# Patient Record
Sex: Female | Born: 1970 | Race: White | Hispanic: No | Marital: Married | Smoking: Never smoker
Health system: Southern US, Community
[De-identification: ages and names within clinical notes are randomized; demographics above are authoritative.]

## PROBLEM LIST (undated history)

## (undated) DIAGNOSIS — L709 Acne, unspecified: Secondary | ICD-10-CM

## (undated) HISTORY — DX: Acne, unspecified: L70.9

## (undated) HISTORY — PX: TONSILLECTOMY: SUR1361

---

## 2001-11-05 HISTORY — PX: NASAL SINUS SURGERY: SHX719

## 2008-10-18 ENCOUNTER — Ambulatory Visit: Payer: Self-pay | Admitting: Family Medicine

## 2010-01-17 ENCOUNTER — Ambulatory Visit: Payer: Self-pay | Admitting: Unknown Physician Specialty

## 2011-01-30 ENCOUNTER — Ambulatory Visit: Payer: Self-pay | Admitting: Family Medicine

## 2012-02-14 ENCOUNTER — Ambulatory Visit: Payer: Self-pay | Admitting: Family Medicine

## 2013-02-16 ENCOUNTER — Ambulatory Visit: Payer: Self-pay | Admitting: Orthopedic Surgery

## 2014-02-18 ENCOUNTER — Ambulatory Visit: Payer: Self-pay | Admitting: Family Medicine

## 2015-02-22 ENCOUNTER — Ambulatory Visit: Admit: 2015-02-22 | Disposition: A | Discharge status: Home / Self Care | Payer: Self-pay | Admitting: Family Medicine

## 2015-02-22 ENCOUNTER — Encounter: Payer: Self-pay | Admitting: Family Medicine

## 2015-12-26 DIAGNOSIS — D239 Other benign neoplasm of skin, unspecified: Secondary | ICD-10-CM

## 2015-12-26 HISTORY — DX: Other benign neoplasm of skin, unspecified: D23.9

## 2017-01-16 ENCOUNTER — Ambulatory Visit (INDEPENDENT_AMBULATORY_CARE_PROVIDER_SITE_OTHER): Payer: BLUE CROSS/BLUE SHIELD | Admitting: Certified Nurse Midwife

## 2017-01-16 ENCOUNTER — Encounter: Payer: Self-pay | Admitting: Certified Nurse Midwife

## 2017-01-16 VITALS — BP 122/82 | HR 77 | Ht 62.0 in | Wt 174.0 lb

## 2017-01-16 DIAGNOSIS — Z01411 Encounter for gynecological examination (general) (routine) with abnormal findings: Secondary | ICD-10-CM | POA: Diagnosis not present

## 2017-01-16 DIAGNOSIS — Z124 Encounter for screening for malignant neoplasm of cervix: Secondary | ICD-10-CM

## 2017-01-16 DIAGNOSIS — N841 Polyp of cervix uteri: Secondary | ICD-10-CM

## 2017-01-16 DIAGNOSIS — T8332XD Displacement of intrauterine contraceptive device, subsequent encounter: Secondary | ICD-10-CM

## 2017-01-16 NOTE — Progress Notes (Signed)
Gynecology Annual Exam  PCP: No PCP Per Patient  Chief Complaint:  Chief Complaint  Patient presents with  . Gynecologic Exam    History of Present Illness: Patient is a 46 y.o. G3P2002 presents for annual exam. The patient was seen in January 2018 for lost IUD strings. An ultrasound at that time showed the IUD was in the endometrium. An endocervical polyp was also noted and plan was to remove this at her annual exam.  LMP: Patient's last menstrual period was 01/04/2017 (exact date). Menses are irregular on the Mirena IUD. Her LMP was the first menses since her January 17 menses. Her LMP was light and lasted 3-4 days with mild cramping. The previous menses was heavy with a lot of cramping. She has had random spotting.  Her last annual was 11/16/2015. Her health has not changed significantly since then. She sees a dermatologist regularly for treatment of acne. Last Pap was 11/16/2015 and was NIL. She has no history of abnormal Pap smears. Her last mammogram was 03/06/2016 and was negative. Her next mammogram is scheduled at [facility] on 03/11/2017. She does self breast exams occasionally. There is no family history of breast or ovarian cancer. She does not drink or use tobacco products. She has started back exercising and is taking a boot camp class. She has lost 3# since January. Her current BMI is 31.83. She does get adequate calcium in her diet and her supplement. She had cholesterol screen in 2017 and it was normal.    Review of Systems: Review of Systems  Constitutional: Negative for chills, fever and weight loss.  HENT: Negative for congestion, sinus pain and sore throat.   Eyes: Negative for blurred vision and pain.  Respiratory: Negative for hemoptysis, shortness of breath and wheezing.   Cardiovascular: Negative for chest pain, palpitations and leg swelling.  Gastrointestinal: Negative for abdominal pain, blood in stool, diarrhea, heartburn, nausea and vomiting.  Genitourinary:  Negative for dysuria, frequency, hematuria and urgency.  Musculoskeletal: Negative for back pain, joint pain and myalgias.  Skin: Negative for itching and rash.  Neurological: Negative for dizziness, tingling and headaches.  Endo/Heme/Allergies: Negative for environmental allergies and polydipsia. Does not bruise/bleed easily.       Negative for hirsutism   Psychiatric/Behavioral: Negative for depression. The patient is not nervous/anxious and does not have insomnia.     Past Medical History:  Past Medical History:  Diagnosis Date  . Acne     Past Surgical History:  Past Surgical History:  Procedure Laterality Date  . NASAL SINUS SURGERY  2003  . TONSILLECTOMY      Gynecologic History: No history of abnormal Pap smears.   Obstetric History: [id number]  Family History:  Family History  Problem Relation Age of Onset  . Hypercholesterolemia Mother   . Hypertension Mother   . Asthma Father   . Hypertension Father     Social History:  Social History   Social History  . Marital status: Married    Spouse name: N/A  . Number of children: 2  . Years of education: 16   Occupational History  . Finance administration    Social History Main Topics  . Smoking status: Never Smoker  . Smokeless tobacco: Never Used  . Alcohol use No  . Drug use: No  . Sexual activity: Yes    Birth control/ protection: IUD   Other Topics Concern  . Not on file   Social History Narrative  . No narrative on file  Allergies:  Allergies  Allergen Reactions  . Sulfa Antibiotics Swelling    Medications: Prior to Admission medications   Medication Sig Start Date End Date Taking? Authorizing Provider  Biotin 1 MG CAPS Take by mouth.   Yes Historical Provider, MD  Cholecalciferol (VITAMIN D-1000 MAX ST) 1000 units tablet Take by mouth.   Yes Historical Provider, MD  Dapsone (ACZONE) 7.5 % GEL APPLY ON THE SKIN DAILY 10/27/15  Yes Historical Provider, MD  doxycycline (ADOXA) 50 MG tablet  Take 50 mg by mouth daily.   Yes Historical Provider, MD  levonorgestrel (MIRENA) 20 MCG/24HR IUD 1 each by Intrauterine route once.   Yes Historical Provider, MD  Omega-3 Fatty Acids (FISH OIL PO) Take by mouth.   Yes Historical Provider, MD  tretinoin (RETIN-A) 1.610 % cream APPLICATION APPLY ON THE SKIN AT BEDTIME 11/08/15  Yes Historical Provider, MD  vitamin B-12 (CYANOCOBALAMIN) 1000 MCG tablet Take by mouth.   Yes Historical Provider, MD  Viactiv-one chew daily.  Physical Exam Vitals: Blood pressure 122/82, pulse 77, height 5\' 2"  (1.575 m), weight 78.9 kg (174 lb), last menstrual period 01/04/2017.  General: NAD HEENT: normocephalic, anicteric Thyroid: no enlargement, no palpable nodules Pulmonary: No increased work of breathing, CTAB Cardiovascular: RRR without murmur Breast: Breast symmetrical, no tenderness, no palpable nodules or masses, no skin or nipple retraction present, no nipple discharge.  No axillary or supraclavicular lymphadenopathy. Abdomen: soft, non-tender, non-distended.  Umbilicus without lesions.  No hepatomegaly. No masses palpable. No evidence of hernia  Genitourinary:  External: Normal external female genitalia.  Normal urethral meatus, normal  Bartholin's and Skene's glands.    Vagina: Normal vaginal mucosa, no evidence of prolapse.    Cervix: endocervical polyp noted, no IUD strings seen. Polyp grasped with uterine packing forceps and twisted off it's stalk. AGNO3 applied to cervical os for hemostasis.  Uterus: AV, NSSC, mobile,NT  Adnexa: ovaries non-enlarged, no adnexal masses, NT  Rectal: deferred  Lymphatic: no evidence of inguinal lymphadenopathy Extremities: no edema, erythema, or tenderness Neurologic: Grossly intact Psychiatric: mood appropriate, affect full       Assessment: 46 y.o. [id number] well woman exam Endocervical polyp IUD strings not present-recent ultrasound confirms normal placement.  Plan:  1)Screening for breast  cancer-Mammogram  - recommend yearly screening mammogram-already scheduled at work -continue SBE 2)Pap done  3) Osteoporosis prevention-taking calcium and vitamin D and has resumed exercise.  4) Routine healthcare maintenance including cholesterol, diabetes screening at work  5) Colon cancer screening-start at age 64  6). No IC x 2 days after polyp removal (may cause bleeding)  7) Follow up 1 year for routine annual   Dalia Heading, [place]

## 2017-01-17 ENCOUNTER — Encounter: Payer: Self-pay | Admitting: Certified Nurse Midwife

## 2017-01-17 DIAGNOSIS — T8332XA Displacement of intrauterine contraceptive device, initial encounter: Secondary | ICD-10-CM | POA: Insufficient documentation

## 2017-01-17 DIAGNOSIS — L709 Acne, unspecified: Secondary | ICD-10-CM | POA: Insufficient documentation

## 2017-01-19 LAB — IGP, APTIMA HPV
HPV APTIMA: NEGATIVE
PAP SMEAR COMMENT: 0

## 2019-09-25 DIAGNOSIS — M25562 Pain in left knee: Secondary | ICD-10-CM | POA: Insufficient documentation

## 2019-09-25 DIAGNOSIS — M222X9 Patellofemoral disorders, unspecified knee: Secondary | ICD-10-CM | POA: Insufficient documentation

## 2020-01-27 NOTE — Progress Notes (Signed)
Gynecology Annual Exam  PCP: Patient, No Pcp Per  Chief Complaint:  Chief Complaint  Patient presents with  . Gynecologic Exam    question about when IUD should be removed    History of Present Illness: Patient is a 49 y.o. [id number] presents for annual exam. She has not had a menses in over a year. Her menses on the Mirena IUD which was inserted 11/01/2014 were irregular prior to that. Has occasional hot flashes.   The patient was seen in January 2018 for lost IUD strings. An ultrasound at that time showed the IUD was in the endometrium  Her last annual was 03/18/2017. Her health has not changed significantly since then. She sees a dermatologist regularly for treatment of acne. Last Pap was 03/18/2017 and was NIL/negative. She has no history of abnormal Pap smears. Her last mammogram was 03/13/2018  and was negative. She gets her mammograms done by the mobile [facility] unit that comes to her work.  She does self breast exams occasionally. There is no family history of breast or ovarian cancer. She does not drink or use tobacco products. She has not been exercising very much since the pandemic closed the gyms down. Does go walking with her husband. . Her current BMI is 31.54. She does get adequate calcium in her diet and her supplement. She had cholesterol screen in 2020 at work  and it was normal.    Review of Systems: Review of Systems  Constitutional: Negative for chills, fever and weight loss.  HENT: Negative for congestion, sinus pain and sore throat.   Eyes: Negative for blurred vision and pain.  Respiratory: Negative for hemoptysis, shortness of breath and wheezing.   Cardiovascular: Negative for chest pain, palpitations and leg swelling.  Gastrointestinal: Negative for abdominal pain, blood in stool, diarrhea, heartburn, nausea and vomiting.  Genitourinary: Negative for dysuria, frequency, hematuria and urgency.  Musculoskeletal: Positive for joint pain (knees). Negative for  back pain and myalgias.  Skin: Negative for itching and rash.  Neurological: Negative for dizziness, tingling and headaches.  Endo/Heme/Allergies: Negative for environmental allergies and polydipsia. Does not bruise/bleed easily.       Negative for hirsutism   Psychiatric/Behavioral: Negative for depression. The patient is not nervous/anxious and does not have insomnia.     Past Medical History:  Past Medical History:  Diagnosis Date  . Acne     Past Surgical History:  Past Surgical History:  Procedure Laterality Date  . NASAL SINUS SURGERY  2003  . TONSILLECTOMY      Gynecologic History: No history of abnormal Pap smears.   Obstetric History: [id number]  Family History:  Family History  Problem Relation Age of Onset  . Hypercholesterolemia Mother   . Hypertension Mother   . Asthma Father   . Hypertension Father     Social History:  Social History   Socioeconomic History  . Marital status: Married    Spouse name: Not on file  . Number of children: 2  . Years of education: 16  . Highest education level: Not on file  Occupational History  . Occupation: Industrial/product designer  Tobacco Use  . Smoking status: Never Smoker  . Smokeless tobacco: Never Used  Substance and Sexual Activity  . Alcohol use: No  . Drug use: No  . Sexual activity: Yes    Birth control/protection: I.U.D.  Other Topics Concern  . Not on file  Social History Narrative  . Not on file   Social Determinants  of Health   Financial Resource Strain:   . Difficulty of Paying Living Expenses:   Food Insecurity:   . Worried About Charity fundraiser in the Last Year:   . Arboriculturist in the Last Year:   Transportation Needs:   . Film/video editor (Medical):   Marland Kitchen Lack of Transportation (Non-Medical):   Physical Activity:   . Days of Exercise per Week:   . Minutes of Exercise per Session:   Stress:   . Feeling of Stress :   Social Connections:   . Frequency of Communication with  Friends and Family:   . Frequency of Social Gatherings with Friends and Family:   . Attends Religious Services:   . Active Member of Clubs or Organizations:   . Attends Archivist Meetings:   Marland Kitchen Marital Status:   Intimate Partner Violence:   . Fear of Current or Ex-Partner:   . Emotionally Abused:   Marland Kitchen Physically Abused:   . Sexually Abused:     Allergies:  Allergies  Allergen Reactions  . Sulfa Antibiotics Swelling    Medications: Current Outpatient Medications:  .  Biotin 1 MG CAPS, Take by mouth., Disp: , Rfl:  .  Calcium-Vitamin D-Vitamin K 500-100-40 MG-UNT-MCG CHEW, Chew 1 Dose by mouth daily., Disp: , Rfl:  .  cetirizine (ZYRTEC) 10 MG tablet, Take 1 tablet by mouth daily., Disp: , Rfl:  .  Cholecalciferol (VITAMIN D-1000 MAX ST) 1000 units tablet, Take by mouth., Disp: , Rfl:  .  fluticasone (FLONASE) 50 MCG/ACT nasal spray, Place 2 sprays into both nostrils daily., Disp: , Rfl:  .  levonorgestrel (MIRENA) 20 MCG/24HR IUD, 1 each by Intrauterine route once., Disp: , Rfl:  .  Omega-3 Fatty Acids (FISH OIL PO), Take by mouth., Disp: , Rfl:  .  spironolactone (ALDACTONE) 50 MG tablet, Take by mouth., Disp: , Rfl:  .  TRI-LUMA 0.01-4-0.05 % CREA, Apply 1 application topically every other day., Disp: , Rfl:  .  vitamin B-12 (CYANOCOBALAMIN) 1000 MCG tablet, Take by mouth., Disp: , Rfl:  .  Dapsone (ACZONE) 7.5 % GEL, APPLY ON THE SKIN DAILY, Disp: , Rfl:  .  doxycycline (ADOXA) 50 MG tablet, Take 50 mg by mouth daily., Disp: , Rfl:  .  tretinoin (RETIN-A) Q000111Q % cream, APPLICATION APPLY ON THE SKIN AT BEDTIME, Disp: , Rfl:  Physical Exam Vitals: BP 110/80   Pulse 84   Ht 5\' 3"  (1.6 m)   Wt 178 lb (80.7 kg)   LMP  (LMP Unknown)   BMI 31.53 kg/m   General: WF in NAD HEENT: normocephalic, anicteric Thyroid: no enlargement, no palpable nodules Pulmonary: No increased work of breathing, CTAB Cardiovascular: RRR without murmur Breast: Breast symmetrical, no  tenderness, no palpable nodules or masses, no skin or nipple retraction present, no nipple discharge.  No axillary or supraclavicular lymphadenopathy. Abdomen: soft, non-tender, non-distended.  Umbilicus without lesions.  No hepatomegaly. No masses palpable. No evidence of hernia  Genitourinary:  External: Normal external female genitalia.  Normal urethral meatus, normal  Bartholin's and Skene's glands.    Vagina: Normal vaginal mucosa, no evidence of prolapse.    Cervix: no IUD strings seen. No lesions  Uterus: AV, NSSC, mobile,NT  Adnexa: ovaries non-enlarged, no adnexal masses, NT  Rectal: deferred  Lymphatic: no evidence of inguinal lymphadenopathy Extremities: no edema, erythema, or tenderness Neurologic: Grossly intact Psychiatric: mood appropriate, affect full       Assessment: 49 y.o. [id number] well  woman exam IUD strings not present but amenorrheic with previous ultrasound showing correct position of IUD ?menopausal  Plan:  1)Screening for breast cancer-Mammogram  - recommend yearly screening mammogram-usually does at work. To let me know if she needs an order for [place] -continue SBE 2)Pap done  3) Osteoporosis prevention-reviewed  calcium and vitamin D requirements and the role of exercise in preventing osteoporosis   4) Routine healthcare maintenance including cholesterol, diabetes screening at work  5) Colon cancer screening-explained options for colon cancer screening and she desires to do Cologuard. Order faxed.   6) Contraception: Will have IUD in place 6 years as of 11/01/2020. Will get [facility] and [facility] today to see if her IUD can come out at her next visit.   7) RTO in 1 year and prn.   Dalia Heading, CNM

## 2020-01-28 ENCOUNTER — Other Ambulatory Visit (HOSPITAL_COMMUNITY)
Admission: RE | Admit: 2020-01-28 | Discharge: 2020-01-28 | Disposition: A | Discharge status: Home / Self Care | Payer: BC Managed Care – PPO | Source: Ambulatory Visit | Attending: Certified Nurse Midwife | Admitting: Certified Nurse Midwife

## 2020-01-28 ENCOUNTER — Encounter: Payer: Self-pay | Admitting: Certified Nurse Midwife

## 2020-01-28 ENCOUNTER — Other Ambulatory Visit: Payer: Self-pay

## 2020-01-28 ENCOUNTER — Ambulatory Visit (INDEPENDENT_AMBULATORY_CARE_PROVIDER_SITE_OTHER): Payer: BC Managed Care – PPO | Admitting: Certified Nurse Midwife

## 2020-01-28 VITALS — BP 110/80 | HR 84 | Ht 63.0 in | Wt 178.0 lb

## 2020-01-28 DIAGNOSIS — Z124 Encounter for screening for malignant neoplasm of cervix: Secondary | ICD-10-CM | POA: Insufficient documentation

## 2020-01-28 DIAGNOSIS — N912 Amenorrhea, unspecified: Secondary | ICD-10-CM

## 2020-01-28 DIAGNOSIS — Z1239 Encounter for other screening for malignant neoplasm of breast: Secondary | ICD-10-CM

## 2020-01-28 DIAGNOSIS — Z30431 Encounter for routine checking of intrauterine contraceptive device: Secondary | ICD-10-CM | POA: Diagnosis not present

## 2020-01-28 DIAGNOSIS — Z1211 Encounter for screening for malignant neoplasm of colon: Secondary | ICD-10-CM

## 2020-01-28 DIAGNOSIS — Z01419 Encounter for gynecological examination (general) (routine) without abnormal findings: Secondary | ICD-10-CM

## 2020-01-29 LAB — FSH/LH
FSH: 4.5 m[IU]/mL
LH: 6.8 m[IU]/mL

## 2020-02-01 LAB — CYTOLOGY - PAP: Diagnosis: NEGATIVE

## 2020-02-11 ENCOUNTER — Other Ambulatory Visit: Payer: Self-pay

## 2020-02-11 ENCOUNTER — Encounter: Payer: Self-pay | Admitting: Dermatology

## 2020-02-11 ENCOUNTER — Ambulatory Visit (INDEPENDENT_AMBULATORY_CARE_PROVIDER_SITE_OTHER): Payer: BC Managed Care – PPO | Admitting: Dermatology

## 2020-02-11 DIAGNOSIS — L700 Acne vulgaris: Secondary | ICD-10-CM

## 2020-02-11 DIAGNOSIS — L811 Chloasma: Secondary | ICD-10-CM

## 2020-02-11 DIAGNOSIS — L710 Perioral dermatitis: Secondary | ICD-10-CM

## 2020-02-11 DIAGNOSIS — L821 Other seborrheic keratosis: Secondary | ICD-10-CM | POA: Diagnosis not present

## 2020-02-11 MED ORDER — SPIRONOLACTONE 25 MG PO TABS: ORAL_TABLET | ORAL | 5 refills | Status: DC

## 2020-02-11 NOTE — Progress Notes (Signed)
   Follow-Up Visit   Subjective  Angelica Gardner is a 49 y.o. female who presents for the following: Acne (patient is currently using Aczone and Spironolactone 25mg  3 po QD. ), melasma (patient is currently using Triluma, she hasn't used the Heliocare yet because she hasn't been out in the sun alot. ), and recheck ISK's (of the L cheek - resolved per patient.).  The following portions of the chart were reviewed this encounter and updated as appropriate:    Review of Systems: No other skin or systemic complaints.  Objective  Well appearing patient in no apparent distress; mood and affect are within normal limits.  A focused examination was performed including the face. Relevant physical exam findings are noted in the Assessment and Plan.  Objective  Face: 6 active papules of the perioral area.  Objective  Face: Modeling on the cheeks.  Objective  Head - Anterior (Face): 6 active papules of the perioral area   Assessment & Plan  Acne vulgaris Face  Continue Spironolactone 25mg  3 po QD and Aczone gel QD  spironolactone (ALDACTONE) 25 MG tablet - Face  Melasma Face  Continue Triluma QHS, recommend Heliocare. Recommend daily broad spectrum sunscreen SPF 30+ to sun-exposed areas, reapply every 2 hours as needed.   Perioral dermatitis Head - Anterior (Face)  Rosacea/mask acne - flaring Start [facility]Rx mix (ivermectin;metronidazole;finacea)QD    Seborrheic Keratoses - Stuck-on, waxy, tan-brown papules and plaques  - Discussed benign etiology and prognosis. - Observe - Call for any changes   Return in about 6 months (around 08/12/2020).   Luther Redo, CMA, am acting as scribe for Sarina Ser, MD .

## 2020-03-15 LAB — COLOGUARD

## 2020-03-23 ENCOUNTER — Encounter: Payer: Self-pay | Admitting: Certified Nurse Midwife

## 2020-08-11 ENCOUNTER — Other Ambulatory Visit: Payer: Self-pay

## 2020-08-11 ENCOUNTER — Ambulatory Visit (INDEPENDENT_AMBULATORY_CARE_PROVIDER_SITE_OTHER): Payer: BC Managed Care – PPO | Admitting: Dermatology

## 2020-08-11 DIAGNOSIS — L700 Acne vulgaris: Secondary | ICD-10-CM

## 2020-08-11 DIAGNOSIS — L811 Chloasma: Secondary | ICD-10-CM

## 2020-08-11 MED ORDER — SPIRONOLACTONE 25 MG PO TABS: ORAL_TABLET | ORAL | 5 refills | Status: DC

## 2020-08-11 NOTE — Progress Notes (Signed)
   Follow-Up Visit   Subjective  Angelica Gardner is a 49 y.o. female who presents for the following: Acne (face, 41m f/u, Spironolacton 25mg  3 po qd, aczone 7.5% gel qd, acne improved) and melasma (face, 45m f/u, Tri-Luma qhs).  The following portions of the chart were reviewed this encounter and updated as appropriate:  Tobacco  Allergies  Meds  Problems  Med Hx  Surg Hx  Fam Hx     Review of Systems:  No other skin or systemic complaints except as noted in HPI or Assessment and Plan.  Objective  Well appearing patient in no apparent distress; mood and affect are within normal limits.  A focused examination was performed including face. Relevant physical exam findings are noted in the Assessment and Plan.  Objective  face: Face clear today  Objective  cheeks: Reticulated hyperpigmented patches.    Assessment & Plan  Acne vulgaris face Exacerbated by mask Improved  Discussed Isotretinoin, pt declines  BP today 114/80  Cont Spironolactone 25mg  3 po qd Cont Aczone 7.5% gel qam  Spironolactone can cause increased urination and cause blood pressure to decrease. Please watch for signs of lightheadedness and be cautious when changing position. It can sometimes cause breast tenderness or an irregular period in premenopausal women. It can also increase potassium. The increase in potassium usually is not a concern unless you are taking other medicines that also increase potassium, so please be sure your doctor knows all of the other medications you are taking. This medication should not be taken  by pregnant women.   Reordered Medications spironolactone (ALDACTONE) 25 MG tablet  Melasma cheeks Cont Tri-Luma qhs to aa face up to 3 months   Return in about 6 months (around 02/09/2021) for Acne/Melasma.   I, Othelia Pulling, RMA, am acting as scribe for Sarina Ser, MD .  Documentation: I have reviewed the above documentation for accuracy and completeness, and I agree  with the above.  Sarina Ser, MD

## 2020-08-12 ENCOUNTER — Encounter: Payer: Self-pay | Admitting: Dermatology

## 2020-08-29 DIAGNOSIS — Z23 Encounter for immunization: Secondary | ICD-10-CM | POA: Diagnosis not present

## 2020-10-10 DIAGNOSIS — Z0189 Encounter for other specified special examinations: Secondary | ICD-10-CM | POA: Diagnosis not present

## 2020-10-19 DIAGNOSIS — Z043 Encounter for examination and observation following other accident: Secondary | ICD-10-CM | POA: Diagnosis not present

## 2020-10-19 DIAGNOSIS — E785 Hyperlipidemia, unspecified: Secondary | ICD-10-CM | POA: Diagnosis not present

## 2020-10-19 DIAGNOSIS — Z713 Dietary counseling and surveillance: Secondary | ICD-10-CM | POA: Diagnosis not present

## 2020-10-24 DIAGNOSIS — R059 Cough, unspecified: Secondary | ICD-10-CM | POA: Diagnosis not present

## 2020-11-15 ENCOUNTER — Other Ambulatory Visit: Payer: Self-pay | Admitting: Dermatology

## 2021-02-09 ENCOUNTER — Other Ambulatory Visit: Payer: Self-pay | Admitting: Dermatology

## 2021-02-09 DIAGNOSIS — L700 Acne vulgaris: Secondary | ICD-10-CM

## 2021-02-15 ENCOUNTER — Encounter: Payer: Self-pay | Admitting: Dermatology

## 2021-02-15 ENCOUNTER — Other Ambulatory Visit: Payer: Self-pay

## 2021-02-15 ENCOUNTER — Ambulatory Visit (INDEPENDENT_AMBULATORY_CARE_PROVIDER_SITE_OTHER): Payer: BC Managed Care – PPO | Admitting: Dermatology

## 2021-02-15 DIAGNOSIS — L700 Acne vulgaris: Secondary | ICD-10-CM | POA: Diagnosis not present

## 2021-02-15 DIAGNOSIS — L811 Chloasma: Secondary | ICD-10-CM | POA: Diagnosis not present

## 2021-02-15 MED ORDER — SPIRONOLACTONE 25 MG PO TABS: 3.0000 | ORAL_TABLET | Freq: Once | ORAL | 1 refills | Status: DC

## 2021-02-15 NOTE — Patient Instructions (Addendum)
If you have any questions or concerns for your doctor, please call our main line at [phone] and press option 4 to reach your doctor's medical assistant. If no one answers, please leave a voicemail as directed and we will return your call as soon as possible. Messages left after 4 pm will be answered the following business day.   You may also send us a message via MyChart. We typically respond to MyChart messages within 1-2 business days.  For prescription refills, please ask your pharmacy to contact our office. Our fax number is [phone].  If you have an urgent issue when the clinic is closed that cannot wait until the next business day, you can page your doctor at the number below.    Please note that while we do our best to be available for urgent issues outside of office hours, we are not available 24/7.   If you have an urgent issue and are unable to reach us, you may choose to seek medical care at your doctor's office, retail clinic, urgent care center, or emergency room.  If you have a medical emergency, please immediately call 911 or go to the emergency department.  Pager Numbers  - Dr. Kowalski: [phone]  - Dr. Moye: [phone]  - Dr. Stewart: [phone]  In the event of inclement weather, please call our main line at [phone] for an update on the status of any delays or closures.  Dermatology Medication Tips: Please keep the boxes that topical medications come in in order to help keep track of the instructions about where and how to use these. Pharmacies typically print the medication instructions only on the boxes and not directly on the medication tubes.   If your medication is too expensive, please contact our office at [phone] option 4 or send us a message through MyChart.   We are unable to tell what your co-pay for medications will be in advance as this is different depending on your insurance coverage. However, we may be able to find a substitute  medication at lower cost or fill out paperwork to get insurance to cover a needed medication.   If a prior authorization is required to get your medication covered by your insurance company, please allow us 1-2 business days to complete this process.  Drug prices often vary depending on where the prescription is filled and some pharmacies may offer cheaper prices.  The website www.goodrx.com contains coupons for medications through different pharmacies. The prices here do not account for what the cost may be with help from insurance (it may be cheaper with your insurance), but the website can give you the price if you did not use any insurance.  - You can print the associated coupon and take it with your prescription to the pharmacy.  - You may also stop by our office during regular business hours and pick up a GoodRx coupon card.  - If you need your prescription sent electronically to a different pharmacy, notify our office through [place] MyChart or by phone at [phone] option 4.  Instructions for [facility] Medications  One or more of your medications was sent to the [facility] mail order compounding pharmacy. You will receive an email from them and can purchase the medicine through that link. It will then be mailed to your home at the address you confirmed. If for any reason you do not receive an email from them, please check your spam folder. If you still do not find the email,   please let us know. [facility] phone number is [phone].   

## 2021-02-15 NOTE — Progress Notes (Signed)
   Follow-Up Visit   Subjective  Angelica Gardner is a 50 y.o. female who presents for the following: Acne (Face, 27m f/u, Spironolactone 25mg  3 po qd, no s/e from spironolactone, Aczone 7.5% gel qod) and Melasma (Face, last used Tri-Luma ~8 wks ago).  The following portions of the chart were reviewed this encounter and updated as appropriate:   Tobacco  Allergies  Meds  Problems  Med Hx  Surg Hx  Fam Hx     Review of Systems:  No other skin or systemic complaints except as noted in HPI or Assessment and Plan.  Objective  Well appearing patient in no apparent distress; mood and affect are within normal limits.  A focused examination was performed including face. Relevant physical exam findings are noted in the Assessment and Plan.  Objective  face: Face clear today  Objective  face: Reticulated hyperpigmented patches.    Assessment & Plan  Acne vulgaris face Chronic and persistent but improved on current treatment regimen.  BP today 117/79  Cont Spironolactone 25mg  3 po qd #270 1rf Cont Aczone 7.5% qod to qd  Discussed Isotretinoin but patient declines isotretinoin today.  Spironolactone can cause increased urination and cause blood pressure to decrease. Please watch for signs of lightheadedness and be cautious when changing position. It can sometimes cause breast tenderness or an irregular period in premenopausal women. It can also increase potassium. The increase in potassium usually is not a concern unless you are taking other medicines that also increase potassium, so please be sure your doctor knows all of the other medications you are taking. This medication should not be taken by pregnant women.  This medicine should also not be taken together with sulfa drugs like Bactrim (trimethoprim/sulfamethexazole).   Reordered Medications spironolactone (ALDACTONE) 25 MG tablet  Melasma face Melasma is a condition of persistent pigmented patches generally on the  face, worse in summer due to higher UV exposure.  Oral estrogen containing BCPs or supplements can exacerbate condition.  Recommend daily broad spectrum tinted sunscreen SPF 30+ to face, preferably with Zinc or Titanium Dioxide. Discussed Rx topical bleaching creams (i.e. hydroquinone), OTC HelioCare supplement, chemical peels (would need multiple for best result).    Will prescribe [facility] Hydroquinone 12%/kojic acid/vitamin C cream pea sized amount twice daily to the entire face for up to 3 months. This cannot be used more than 3 months due to risk of exogenous ochronosis (permanent dark spots). The patient was advised this is not covered by insurance since it is made by a compounding pharmacy. They will receive an email to check out and the medication will be mailed to their home.    Return in about 6 months (around 08/17/2021) for Acne/Melasma f/u.  I, Othelia Pulling, RMA, am acting as scribe for Sarina Ser, MD .  Documentation: I have reviewed the above documentation for accuracy and completeness, and I agree with the above.  Sarina Ser, MD

## 2021-08-23 ENCOUNTER — Other Ambulatory Visit: Payer: Self-pay

## 2021-08-23 ENCOUNTER — Ambulatory Visit (INDEPENDENT_AMBULATORY_CARE_PROVIDER_SITE_OTHER): Payer: BC Managed Care – PPO | Admitting: Dermatology

## 2021-08-23 DIAGNOSIS — L700 Acne vulgaris: Secondary | ICD-10-CM

## 2021-08-23 MED ORDER — SPIRONOLACTONE 25 MG PO TABS: 75.0000 mg | ORAL_TABLET | Freq: Once | ORAL | 1 refills | Status: DC

## 2021-08-23 MED ORDER — WINLEVI 1 % EX CREA: 1.0000 "application " | TOPICAL_CREAM | Freq: Every day | CUTANEOUS | 6 refills | Status: DC

## 2021-08-23 NOTE — Progress Notes (Signed)
   Follow-Up Visit   Subjective  Angelica Gardner is a 50 y.o. female who presents for the following: Acne (6 month follow up - Spironolactone 25 mg 3 po qd, Aczone prn flares).  The following portions of the chart were reviewed this encounter and updated as appropriate:   Tobacco  Allergies  Meds  Problems  Med Hx  Surg Hx  Fam Hx     Review of Systems:  No other skin or systemic complaints except as noted in HPI or Assessment and Plan.  Objective  Well appearing patient in no apparent distress; mood and affect are within normal limits.  A focused examination was performed including face. Relevant physical exam findings are noted in the Assessment and Plan.  Face B/P today 135/89  2 pink papules of chin.  Mild hyperpigmented spotty macules of mandible.   Assessment & Plan  Acne vulgaris Face Chronic and persistent; not to goal but improved. Pt declines Isotretinoin. Start Winlevi Continue oral Spironolactone Continue Aczone topically.  Clascoterone (WINLEVI) 1 % CREA - Face Apply 1 application topically daily. Related Medication spironolactone (ALDACTONE) 25 MG tablet Take 3 tablets (75 mg total) by mouth once for 1 dose. TAKE 3 TABLETS BY MOUTH EVERY DAY  Return in about 6 months (around 02/21/2022) for Acne.  I, Ashok Cordia, CMA, am acting as scribe for Sarina Ser, MD . Documentation: I have reviewed the above documentation for accuracy and completeness, and I agree with the above.  Sarina Ser, MD

## 2021-08-23 NOTE — Patient Instructions (Signed)

## 2021-08-24 ENCOUNTER — Encounter: Payer: Self-pay | Admitting: Dermatology

## 2021-09-07 DIAGNOSIS — Z23 Encounter for immunization: Secondary | ICD-10-CM | POA: Diagnosis not present

## 2021-10-10 DIAGNOSIS — Z0189 Encounter for other specified special examinations: Secondary | ICD-10-CM | POA: Diagnosis not present

## 2021-10-25 DIAGNOSIS — Z0189 Encounter for other specified special examinations: Secondary | ICD-10-CM | POA: Diagnosis not present

## 2021-11-07 DIAGNOSIS — Z7189 Other specified counseling: Secondary | ICD-10-CM | POA: Diagnosis not present

## 2021-11-29 ENCOUNTER — Other Ambulatory Visit: Payer: Self-pay | Admitting: Obstetrics and Gynecology

## 2021-11-29 ENCOUNTER — Ambulatory Visit
Admission: RE | Admit: 2021-11-29 | Discharge: 2021-11-29 | Disposition: A | Discharge status: Home / Self Care | Payer: BC Managed Care – PPO | Source: Ambulatory Visit | Attending: Obstetrics and Gynecology | Admitting: Obstetrics and Gynecology

## 2021-11-29 ENCOUNTER — Other Ambulatory Visit: Payer: Self-pay

## 2021-11-29 DIAGNOSIS — Z1231 Encounter for screening mammogram for malignant neoplasm of breast: Secondary | ICD-10-CM | POA: Diagnosis not present

## 2021-12-18 DIAGNOSIS — U071 COVID-19: Secondary | ICD-10-CM | POA: Diagnosis not present

## 2021-12-18 DIAGNOSIS — R059 Cough, unspecified: Secondary | ICD-10-CM | POA: Diagnosis not present

## 2022-01-15 ENCOUNTER — Telehealth: Payer: Self-pay | Admitting: Obstetrics and Gynecology

## 2022-01-15 NOTE — Telephone Encounter (Signed)
Pt is scheduled for Mirena removal and reinsertion with ABC on 4/18. ?

## 2022-01-18 NOTE — Telephone Encounter (Signed)
Patient r/s to 4/28 with ABC

## 2022-01-19 NOTE — Telephone Encounter (Signed)
Noted. Will order to arrive by apt date/time. 

## 2022-02-16 ENCOUNTER — Other Ambulatory Visit: Payer: Self-pay | Admitting: Dermatology

## 2022-02-16 DIAGNOSIS — L700 Acne vulgaris: Secondary | ICD-10-CM

## 2022-02-20 ENCOUNTER — Ambulatory Visit: Payer: Self-pay | Admitting: Obstetrics and Gynecology

## 2022-02-21 DIAGNOSIS — H1032 Unspecified acute conjunctivitis, left eye: Secondary | ICD-10-CM | POA: Diagnosis not present

## 2022-02-26 NOTE — Telephone Encounter (Signed)
Patient rescheduled to 03/26/22 with ABC

## 2022-02-27 DIAGNOSIS — J069 Acute upper respiratory infection, unspecified: Secondary | ICD-10-CM | POA: Diagnosis not present

## 2022-02-28 ENCOUNTER — Encounter: Payer: Self-pay | Admitting: Dermatology

## 2022-02-28 ENCOUNTER — Ambulatory Visit: Payer: Self-pay | Admitting: Obstetrics and Gynecology

## 2022-02-28 ENCOUNTER — Ambulatory Visit (INDEPENDENT_AMBULATORY_CARE_PROVIDER_SITE_OTHER): Payer: BC Managed Care – PPO | Admitting: Dermatology

## 2022-02-28 DIAGNOSIS — L700 Acne vulgaris: Secondary | ICD-10-CM | POA: Diagnosis not present

## 2022-02-28 DIAGNOSIS — Z79899 Other long term (current) drug therapy: Secondary | ICD-10-CM | POA: Diagnosis not present

## 2022-02-28 MED ORDER — SPIRONOLACTONE 25 MG PO TABS: 75.0000 mg | ORAL_TABLET | Freq: Every day | ORAL | 1 refills | Status: DC

## 2022-02-28 NOTE — Patient Instructions (Addendum)
Continue Winlevi once daily to face. ?Continue Dapsone once daily to face.  ?Continue Spironolactone '25mg'$  3 tablets daily.  ? ?Spironolactone can cause increased urination and cause blood pressure to decrease. Please watch for signs of lightheadedness and be cautious when changing position. It can sometimes cause breast tenderness or an irregular period in premenopausal women. It can also increase potassium. The increase in potassium usually is not a concern unless you are taking other medicines that also increase potassium, so please be sure your doctor knows all of the other medications you are taking. This medication should not be taken by pregnant women.  This medicine should also not be taken together with sulfa drugs like Bactrim (trimethoprim/sulfamethexazole).   ? ?If You Need Anything After Your Visit ? ?If you have any questions or concerns for your doctor, please call our main line at [phone] and press option 4 to reach your doctor's medical assistant. If no one answers, please leave a voicemail as directed and we will return your call as soon as possible. Messages left after 4 pm will be answered the following business day.  ? ?You may also send [place] a message via MyChart. We typically respond to MyChart messages within 1-2 business days. ? ?For prescription refills, please ask your pharmacy to contact our office. Our fax number is [phone]. ? ?If you have an urgent issue when the clinic is closed that cannot wait until the next business day, you can page your doctor at the number below.   ? ?Please note that while we do our best to be available for urgent issues outside of office hours, we are not available 24/7.  ? ?If you have an urgent issue and are unable to reach [place], you may choose to seek medical care at your doctor's office, retail clinic, urgent care center, or emergency room. ? ?If you have a medical emergency, please immediately call 911 or go to the emergency department. ? ?Pager  Numbers ? ?- Dr. Nehemiah Massed: [phone] ? ?- Dr. Laurence Ferrari: [phone] ? ?- Dr. Nicole Kindred: [phone] ? ?In the event of inclement weather, please call our main line at [phone] for an update on the status of any delays or closures. ? ?Dermatology Medication Tips: ?Please keep the boxes that topical medications come in in order to help keep track of the instructions about where and how to use these. Pharmacies typically print the medication instructions only on the boxes and not directly on the medication tubes.  ? ?If your medication is too expensive, please contact our office at [phone] option 4 or send [place] a message through [place].  ? ?We are unable to tell what your co-pay for medications will be in advance as this is different depending on your insurance coverage. However, we may be able to find a substitute medication at lower cost or fill out paperwork to get insurance to cover a needed medication.  ? ?If a prior authorization is required to get your medication covered by your insurance company, please allow [place] 1-2 business days to complete this process. ? ?Drug prices often vary depending on where the prescription is filled and some pharmacies may offer cheaper prices. ? ?The website www.goodrx.com contains coupons for medications through different pharmacies. The prices here do not account for what the cost may be with help from insurance (it may be cheaper with your insurance), but the website can give you the price if you did not use any insurance.  ?- You can print the associated coupon and  take it with your prescription to the pharmacy.  ?- You may also stop by our office during regular business hours and pick up a GoodRx coupon card.  ?- If you need your prescription sent electronically to a different pharmacy, notify our office through [facility] or by phone at [phone] option 4. ? ? ? ? ?Si Usted Necesita Algo Despu?s de Su Visita ? ?Tambi?n puede enviarnos un mensaje a trav?s de  MyChart. Por lo general respondemos a los mensajes de MyChart en el transcurso de 1 a 2 d?as h?biles. ? ?Para renovar recetas, por favor pida a su farmacia que se ponga en contacto con nuestra oficina. Nuestro n?mero de fax es el [phone]. ? ?Si tiene un asunto urgente cuando la cl?nica est? cerrada y que no puede esperar hasta el siguiente d?a h?bil, puede llamar/localizar a su doctor(a) al n?mero que aparece a continuaci?n.  ? ?Por favor, tenga en cuenta que aunque hacemos todo lo posible para estar disponibles para asuntos urgentes fuera del horario de oficina, no estamos disponibles las 24 horas del d?a, los 7 d?as de la semana.  ? ?Si tiene un problema urgente y no puede comunicarse con nosotros, puede optar por buscar atenci?n m?dica  en el consultorio de su doctor(a), en una cl?nica privada, en un centro de atenci?n urgente o en una sala de emergencias. ? ?Si tiene Engineer, maintenance (IT) m?dica, por favor llame inmediatamente al 911 o vaya a la sala de emergencias. ? ?N?meros de b?per ? ?- Dr. Nehemiah Massed: [phone] ? ?- Dra. Moye: [phone] ? ?- Dra. Nicole Kindred: [phone] ? ?En caso de inclemencias del tiempo, por favor llame a nuestra l?nea principal al [phone] para una actualizaci?n sobre el estado de cualquier retraso o cierre. ? ?Consejos para la medicaci?n en dermatolog?a: ?Por favor, guarde las cajas en las que vienen los medicamentos de uso t?pico para ayudarle a seguir las instrucciones sobre d?nde y c?mo usarlos. Las farmacias generalmente imprimen las instrucciones del medicamento s?lo en las cajas y no directamente en los tubos del [place].  ? ?Si su medicamento es muy caro, por favor, p?ngase en contacto con Zigmund Daniel llamando al [phone] y presione la opci?n 4 o env?enos un mensaje a trav?s de MyChart.  ? ?No podemos decirle cu?l ser? su copago por los medicamentos por adelantado ya que esto es diferente dependiendo de la cobertura de su seguro. Sin embargo, es posible que  podamos encontrar un medicamento sustituto a Electrical engineer un formulario para que el seguro cubra el medicamento que se considera necesario.  ? ?Si se requiere Ardelia Mems autorizaci?n previa para que su compa??a de seguros [place] su medicamento, por favor perm?tanos de 1 a 2 d?as h?biles para completar este proceso. ? ?Los precios de los medicamentos var?an con frecuencia dependiendo del Environmental consultant de d?nde se surte la receta y alguna farmacias pueden ofrecer precios m?s baratos. ? ?El sitio web www.goodrx.com tiene cupones para medicamentos de Airline pilot. Los precios aqu? no tienen en cuenta lo que podr?a costar con la ayuda del seguro (puede ser m?s barato con su seguro), pero el sitio web puede darle el precio si no utiliz? ning?n seguro.  ?- Puede imprimir el cup?n correspondiente y llevarlo con su receta a la farmacia.  ?- Tambi?n puede pasar por nuestra oficina durante el horario de atenci?n regular y recoger una tarjeta de cupones de GoodRx.  ?- Si necesita que su receta se env?e electr?nicamente a Chiropodist, informe a nuestra oficina a trav?s de Pharmacist, community  [place] o por tel?fono llamando al [phone] y presione la opci?n 4.  ?

## 2022-02-28 NOTE — Progress Notes (Signed)
? ?  Follow-Up Visit ?  ?Subjective  ?Angelica Gardner is a 51 y.o. female who presents for the following: Acne (6 month recheck. Face. Using Winlevi, Dapsone (not consistently), and taking Spironolactone '25mg'$  3 tablets daily as directed. Tolerating well, acne is stable. Having very rare break outs). ? ?The following portions of the chart were reviewed this encounter and updated as appropriate:  Tobacco  Allergies  Meds  Problems  Med Hx  Surg Hx  Fam Hx   ?  ?Review of Systems: No other skin or systemic complaints except as noted in HPI or Assessment and Plan. ? ? ?Objective  ?Well appearing patient in no apparent distress; mood and affect are within normal limits. ? ?A focused examination was performed including face, neck. Relevant physical exam findings are noted in the Assessment and Plan. ? ?Head - Anterior (Face) ?Few erythematous papules at perioral area ? ? ?Assessment & Plan  ?Acne vulgaris ?Head - Anterior (Face) ?Chronic and persistent condition with duration or expected duration over one year. Condition is symptomatic / bothersome to patient. Not to goal. ? ?Continue Winlevi once daily to face. ?Continue Dapsone once daily to face.  ?Continue Spironolactone '25mg'$  3 tablets daily.  ? ?Discussed increasing to Spironolactone 100 mg daily, patient prefers to stay at 75 mg daily.  ? ?BP: 125/80 ? ?Long term medication management.  Patient is using long term (months to years) prescription medication  to control their dermatologic condition.  These medications require periodic monitoring to evaluate for efficacy and side effects and may require periodic laboratory monitoring. ? ?Related Medications ?Clascoterone (WINLEVI) 1 % CREA ?Apply 1 application topically daily. ? ?spironolactone (ALDACTONE) 25 MG tablet ?Take 3 tablets (75 mg total) by mouth daily. ? ?Return in about 6 months (around 08/30/2022) for Acne Follow Up, TBSE. ? ?I, Emelia Salisbury, CMA, am acting as scribe for Sarina Ser,  MD. ?Documentation: I have reviewed the above documentation for accuracy and completeness, and I agree with the above. ? ?Sarina Ser, MD ? ? ?

## 2022-03-01 NOTE — Telephone Encounter (Signed)
Noted  

## 2022-03-11 ENCOUNTER — Encounter: Payer: Self-pay | Admitting: Dermatology

## 2022-03-25 NOTE — Progress Notes (Signed)
PCP: Patient, No Pcp Per (Inactive)   Chief Complaint  Patient presents with  . Gynecologic Exam    Some spotting every now and then x 6 months, no cramping noticed during spotting    HPI:      [name] is a 51 y.o. [id number] whose LMP was No LMP recorded. (Menstrual status: IUD)., presents today for her annual examination.  Her menses are {norm/abn:715}, lasting {number: 22536} days.  Dysmenorrhea {dysmen:716}. She {does:18564} have intermenstrual bleeding. She {does:18564} have vasomotor sx.   Sex activity: single partner, contraception - {contraception:315051}. Mirena placed 11/01/14; She {does:18564} have vaginal dryness.  Last Pap: 01/28/20 Results were: no abnormalities /neg HPV DNA 2018 Hx of STDs: {STD hx:14358}  Last mammogram: 11/29/21 Results were: normal--routine follow-up in 12 months There is no FH of breast cancer. There is no FH of ovarian cancer. The patient {does:18564} do self-breast exams.  Colonoscopy: NEG cologuard 5/21; repeat due after 3 years.   Tobacco use: {tob:20664} Alcohol use: {Alcohol:11675} No drug use Exercise: {exercise:31265}  She {does:18564} get adequate calcium and Vitamin D in her diet.  Labs with PCP.   Patient Active Problem List   Diagnosis Date Noted  . IUD strings lost 01/17/2017  . Acne 01/17/2017    Past Surgical History:  Procedure Laterality Date  . NASAL SINUS SURGERY  2003  . TONSILLECTOMY      Family History  Problem Relation Age of Onset  . Hypercholesterolemia Mother   . Hypertension Mother   . Asthma Father   . Hypertension Father   . Breast cancer Neg Hx     Social History   Socioeconomic History  . Marital status: Married    Spouse name: Not on file  . Number of children: 2  . Years of education: 72  . Highest education level: Not on file  Occupational History  . Occupation: Industrial/product designer  Tobacco Use  . Smoking status: Never  . Smokeless tobacco: Never  Vaping Use  .  Vaping Use: Never used  Substance and Sexual Activity  . Alcohol use: No  . Drug use: No  . Sexual activity: Yes    Birth control/protection: I.U.D.    Comment: Mirena  Other Topics Concern  . Not on file  Social History Narrative  . Not on file   Social Determinants of Health   Financial Resource Strain: Not on file  Food Insecurity: Not on file  Transportation Needs: Not on file  Physical Activity: Not on file  Stress: Not on file  Social Connections: Not on file  Intimate Partner Violence: Not on file     Current Outpatient Medications:  .  Biotin 1 MG CAPS, Take by mouth., Disp: , Rfl:  .  cetirizine (ZYRTEC) 10 MG tablet, Take 1 tablet by mouth daily., Disp: , Rfl:  .  Cholecalciferol 25 MCG (1000 UT) tablet, Take by mouth., Disp: , Rfl:  .  Clascoterone (WINLEVI) 1 % CREA, Apply 1 application topically daily., Disp: 60 g, Rfl: 6 .  Dapsone 7.5 % GEL, APPLY A SMALL AMOUNT TO AFFECTED AREA EVERY MORNING, Disp: 60 g, Rfl: 2 .  fluticasone (FLONASE) 50 MCG/ACT nasal spray, Place 2 sprays into both nostrils daily., Disp: , Rfl:  .  levonorgestrel (MIRENA) 20 MCG/24HR IUD, 1 each by Intrauterine route once., Disp: , Rfl:  .  Omega-3 Fatty Acids (FISH OIL PO), Take by mouth., Disp: , Rfl:  .  spironolactone (ALDACTONE) 25 MG tablet, Take 3 tablets (75  mg total) by mouth daily., Disp: 270 tablet, Rfl: 1 .  TRI-LUMA 0.01-4-0.05 % CREA, APPLY A SMALL AMOUNT TO AFFECTED AREA AT BEDTIME, Disp: 30 g, Rfl: 0 .  vitamin B-12 (CYANOCOBALAMIN) 1000 MCG tablet, Take by mouth., Disp: , Rfl:      ROS:  Review of Systems BREAST: No symptoms    Objective: BP 122/84   Ht '5\' 2"'$  (1.575 m)   Wt 182 lb (82.6 kg)   BMI 33.29 kg/m    OBGyn Exam  Results: No results found for this or any previous visit (from the past 24 hour(s)).  Assessment/Plan:  Encounter for annual routine gynecological examination  Cervical cancer screening  Screening for HPV (human  papillomavirus)  Encounter for removal and reinsertion of intrauterine contraceptive device (IUD)  Encounter for screening mammogram for malignant neoplasm of breast   No orders of the defined types were placed in this encounter.           GYN counsel {counseling: 16159}    F/U  No follow-ups on file.  Doyal Saric B. Silvano Garofano, PA-C 03/26/2022 1:32 PM

## 2022-03-26 ENCOUNTER — Encounter: Payer: Self-pay | Admitting: Obstetrics and Gynecology

## 2022-03-26 ENCOUNTER — Ambulatory Visit (INDEPENDENT_AMBULATORY_CARE_PROVIDER_SITE_OTHER): Payer: BC Managed Care – PPO | Admitting: Obstetrics and Gynecology

## 2022-03-26 ENCOUNTER — Other Ambulatory Visit (HOSPITAL_COMMUNITY)
Admission: RE | Admit: 2022-03-26 | Discharge: 2022-03-26 | Disposition: A | Discharge status: Home / Self Care | Payer: BC Managed Care – PPO | Source: Ambulatory Visit

## 2022-03-26 VITALS — BP 122/84 | Ht 62.0 in | Wt 182.0 lb

## 2022-03-26 DIAGNOSIS — Z30431 Encounter for routine checking of intrauterine contraceptive device: Secondary | ICD-10-CM | POA: Diagnosis not present

## 2022-03-26 DIAGNOSIS — Z01419 Encounter for gynecological examination (general) (routine) without abnormal findings: Secondary | ICD-10-CM | POA: Diagnosis not present

## 2022-03-26 DIAGNOSIS — N841 Polyp of cervix uteri: Secondary | ICD-10-CM | POA: Diagnosis not present

## 2022-03-26 DIAGNOSIS — Z1231 Encounter for screening mammogram for malignant neoplasm of breast: Secondary | ICD-10-CM

## 2022-03-26 DIAGNOSIS — Z30433 Encounter for removal and reinsertion of intrauterine contraceptive device: Secondary | ICD-10-CM

## 2022-03-26 DIAGNOSIS — Z1151 Encounter for screening for human papillomavirus (HPV): Secondary | ICD-10-CM

## 2022-03-26 DIAGNOSIS — Z124 Encounter for screening for malignant neoplasm of cervix: Secondary | ICD-10-CM | POA: Diagnosis not present

## 2022-03-26 NOTE — Patient Instructions (Signed)
I value your feedback and you entrusting [place] with your care. If you get a [facility] patient survey, I would appreciate you taking the time to let us know about your experience today. Thank you!  [place] [facility]: [place]: [facility] [phone]  Instructions after IUD insertion  Most women experience no significant problems after insertion of an IUD, however minor cramping and spotting for a few days is common. Cramps may be treated with ibuprofen '800mg'$  every 8 hours or Tylenol 650 mg every 4 hours. Contact [facility] immediately if you experience any of the following symptoms during the next week: temperature >99.6 degrees, worsening pelvic pain, abdominal pain, fainting, unusually heavy vaginal bleeding, foul vaginal discharge, or if you think you have expelled the IUD.  Nothing inserted in the vagina for 48 hours. You will be scheduled for a follow up visit in approximately four weeks.  You should check monthly to be sure you can feel the IUD strings in the upper vagina. If you are having a monthly period, try to check after each period. If you cannot feel the IUD strings,  contact [facility] immediately so we can do an exam to determine if the IUD has been expelled.   Please use backup protection until we can confirm the IUD is in place.  Call [facility] if you are exposed to or diagnosed with a sexually transmitted infection, as we will need to discuss whether it is safe for you to continue using an IUD.

## 2022-03-27 ENCOUNTER — Other Ambulatory Visit (HOSPITAL_COMMUNITY)
Admission: RE | Admit: 2022-03-27 | Discharge: 2022-03-27 | Disposition: A | Discharge status: Home / Self Care | Payer: BC Managed Care – PPO | Source: Ambulatory Visit | Attending: Obstetrics and Gynecology | Admitting: Obstetrics and Gynecology

## 2022-03-27 ENCOUNTER — Encounter: Payer: Self-pay | Admitting: Obstetrics and Gynecology

## 2022-03-27 DIAGNOSIS — N841 Polyp of cervix uteri: Secondary | ICD-10-CM | POA: Insufficient documentation

## 2022-03-27 DIAGNOSIS — Z124 Encounter for screening for malignant neoplasm of cervix: Secondary | ICD-10-CM | POA: Diagnosis not present

## 2022-03-27 DIAGNOSIS — N72 Inflammatory disease of cervix uteri: Secondary | ICD-10-CM | POA: Diagnosis not present

## 2022-03-27 DIAGNOSIS — Z1151 Encounter for screening for human papillomavirus (HPV): Secondary | ICD-10-CM | POA: Insufficient documentation

## 2022-03-28 LAB — CYTOLOGY - PAP
Comment: NEGATIVE
Diagnosis: NEGATIVE
High risk HPV: NEGATIVE

## 2022-03-29 LAB — SURGICAL PATHOLOGY

## 2022-08-30 ENCOUNTER — Ambulatory Visit (INDEPENDENT_AMBULATORY_CARE_PROVIDER_SITE_OTHER): Payer: BC Managed Care – PPO | Admitting: Dermatology

## 2022-08-30 DIAGNOSIS — L700 Acne vulgaris: Secondary | ICD-10-CM

## 2022-08-30 DIAGNOSIS — L821 Other seborrheic keratosis: Secondary | ICD-10-CM | POA: Diagnosis not present

## 2022-08-30 DIAGNOSIS — Z79899 Other long term (current) drug therapy: Secondary | ICD-10-CM | POA: Diagnosis not present

## 2022-08-30 IMAGING — MG MM DIGITAL SCREENING BILAT W/ TOMO AND CAD
8 series · 8 of 24 positions shown · non-contrast
Comparison: Previous exam(s).

CLINICAL DATA: Screening.

EXAM:
DIGITAL SCREENING BILATERAL MAMMOGRAM WITH TOMOSYNTHESIS AND CAD
TECHNIQUE: Bilateral screening digital craniocaudal and mediolateral oblique
mammograms were obtained. Bilateral screening digital breast
tomosynthesis was performed. The images were evaluated with
computer-aided detection.

[L CC synth-2D]
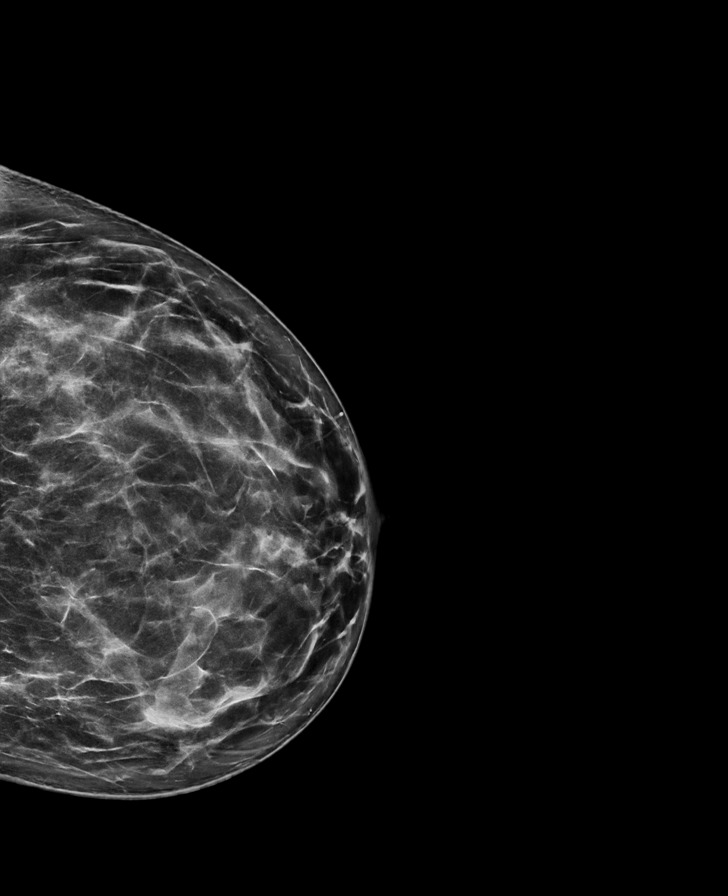

[R MLO synth-2D]
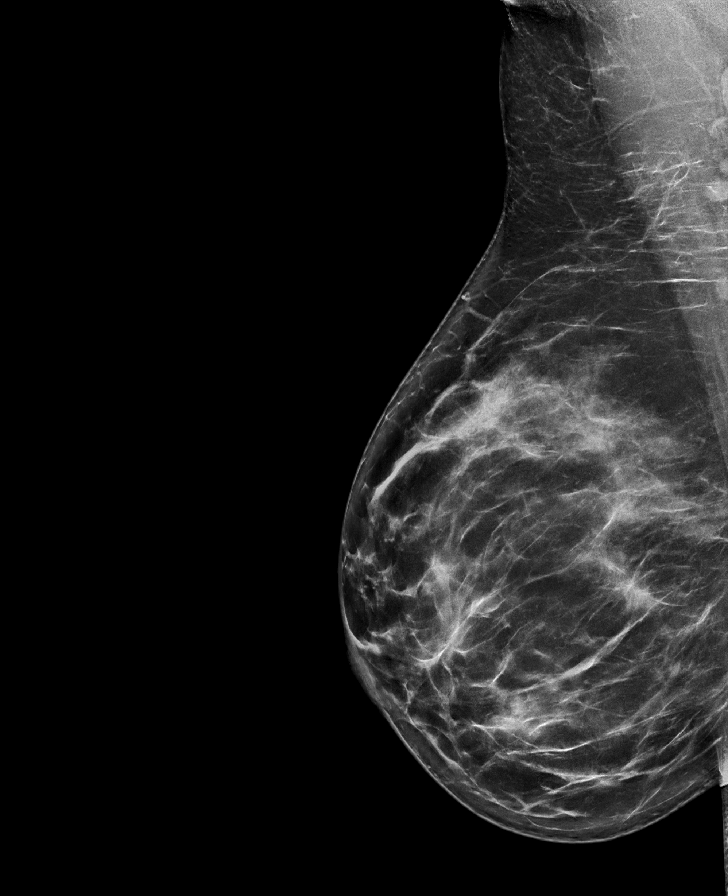

[L MLO synth-2D]
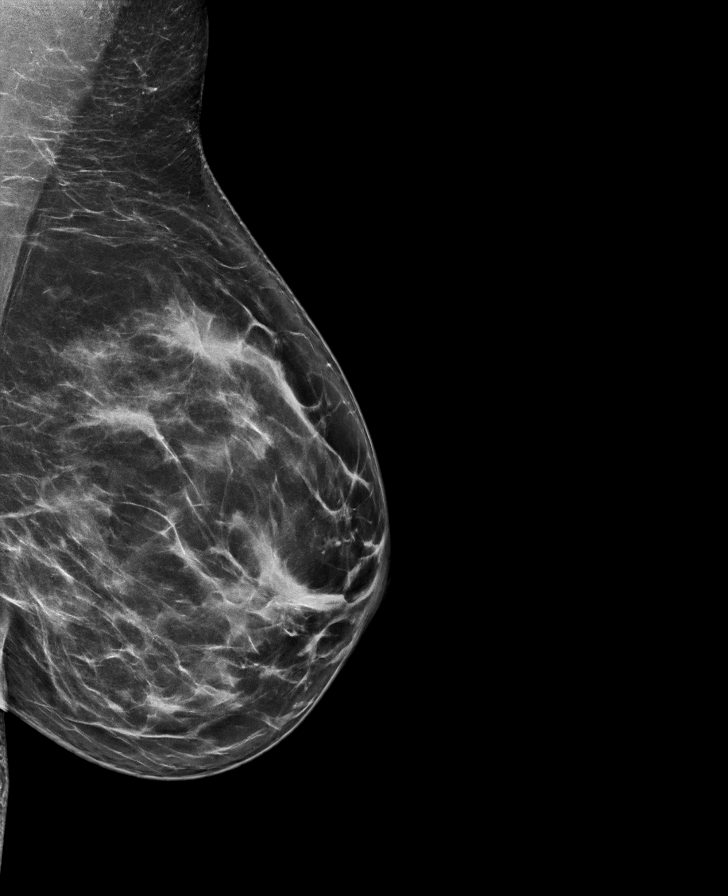

[R CC synth-2D]
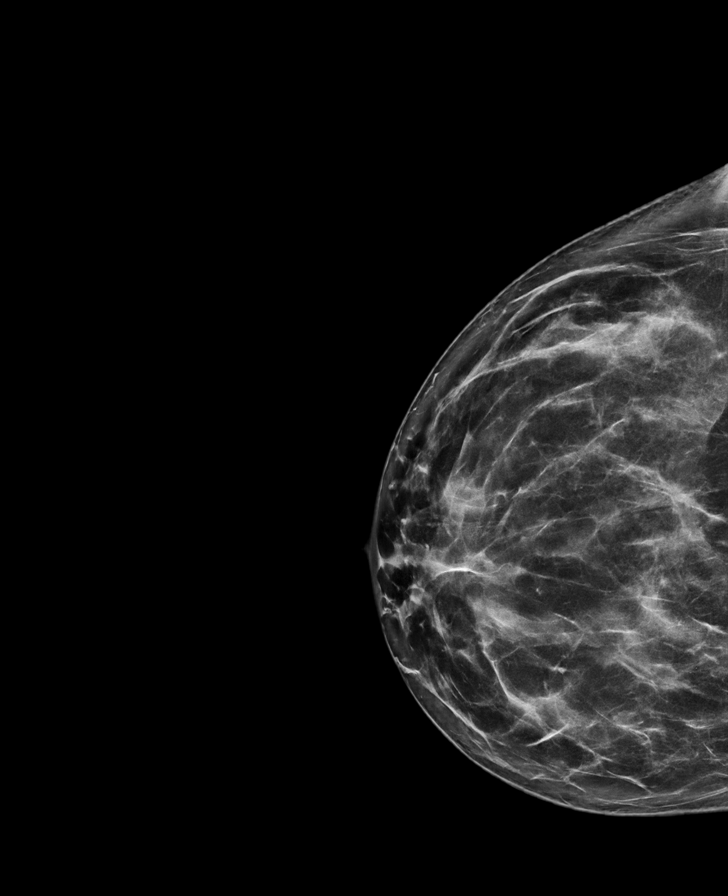

[L MLO tomo · tomo slice 45/89.0]
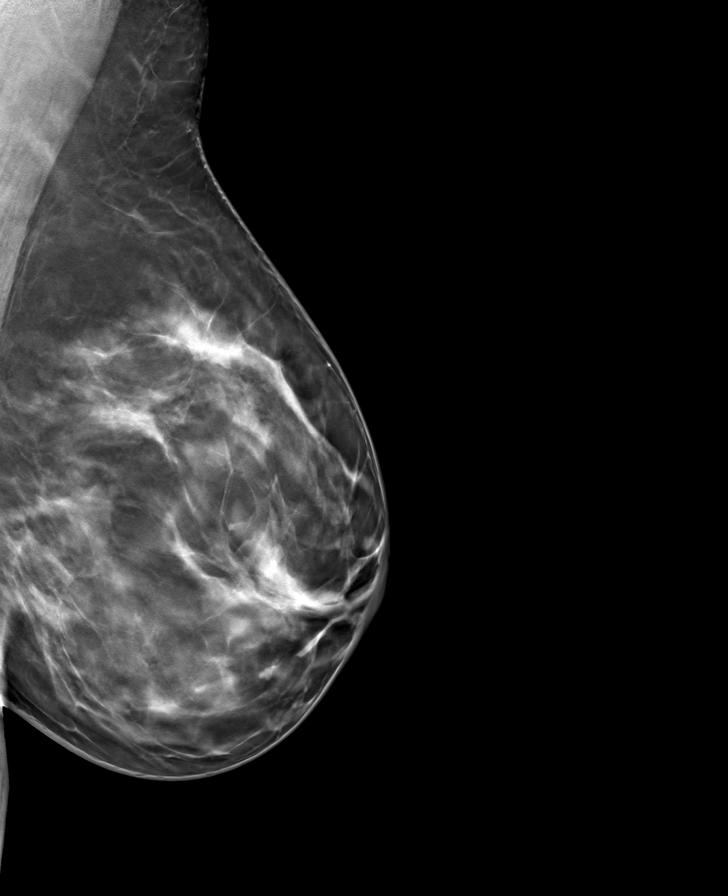

[R MLO tomo · tomo slice 45/90.0]
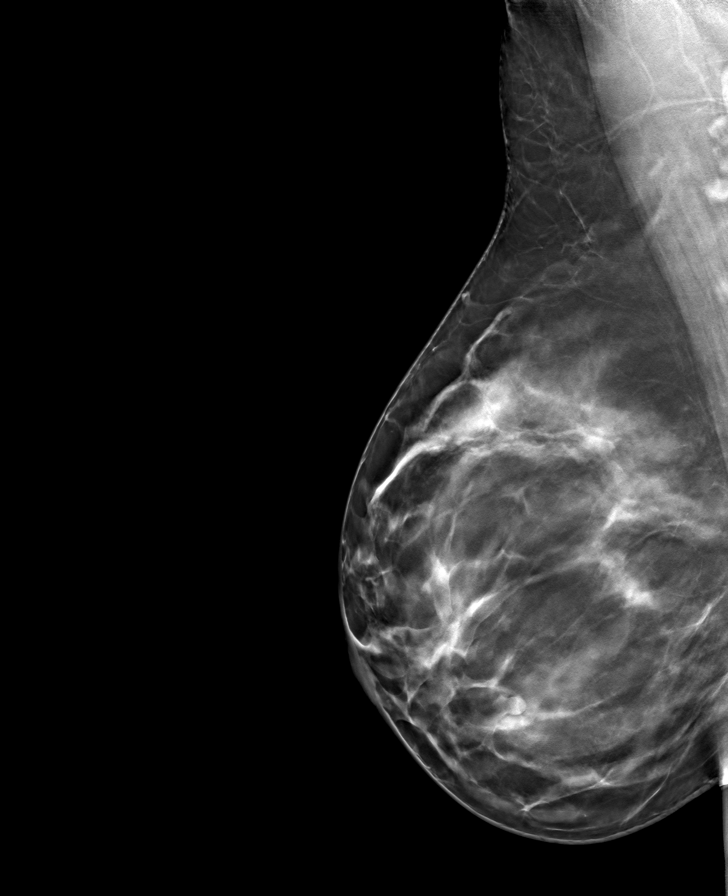

[R CC tomo · tomo slice 43/86.0]
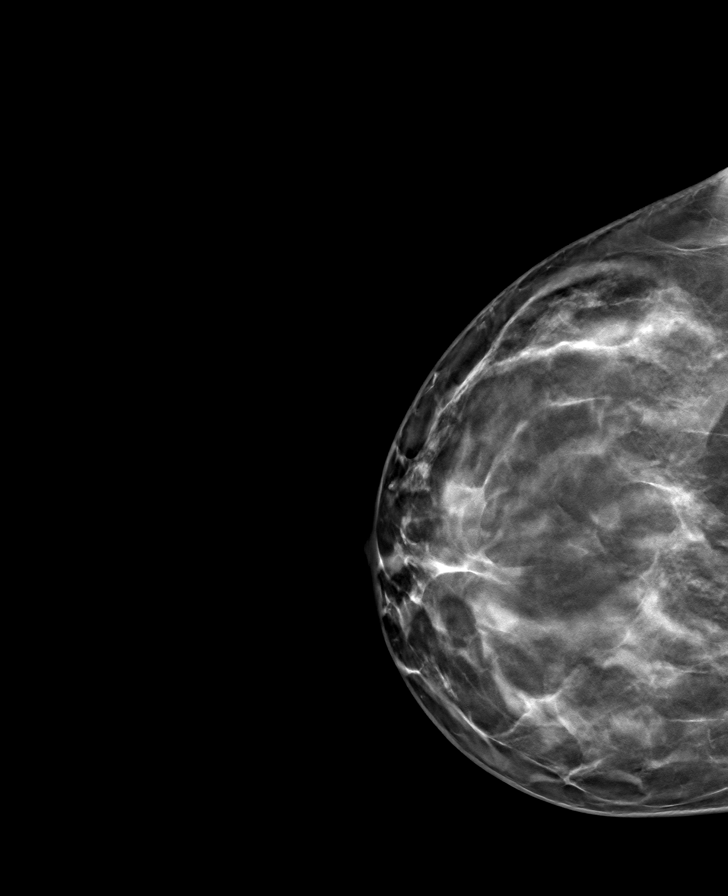

[L CC tomo · tomo slice 42/83.0]
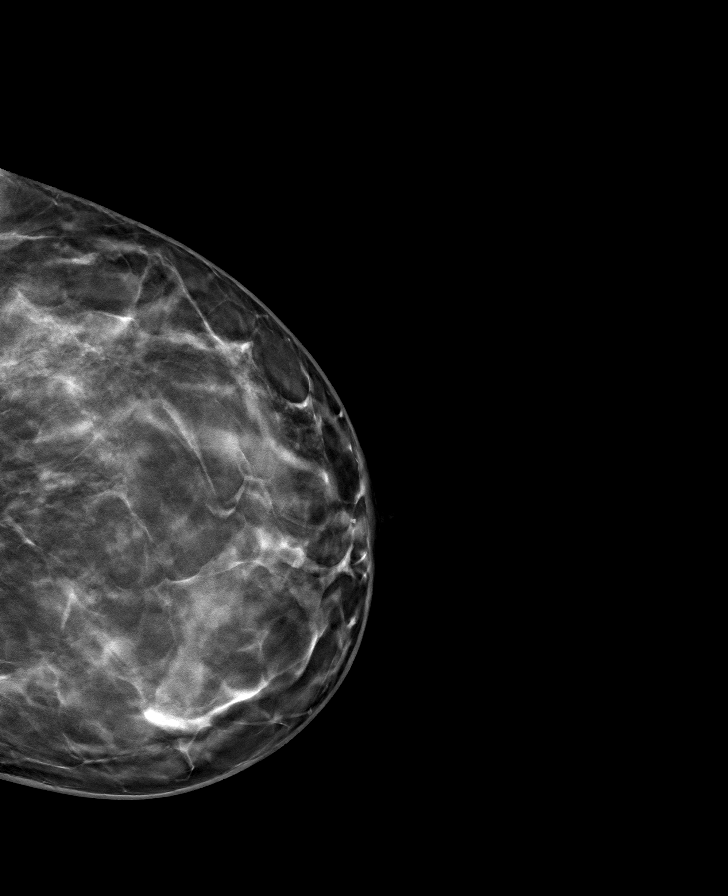

[8 of 24 positions shown; findings below may reference images not displayed]

ACR Breast Density Category c: The breast tissue is heterogeneously
dense, which may obscure small masses.
FINDINGS: There are no findings suspicious for malignancy.
IMPRESSION: No mammographic evidence of malignancy. A result letter of this
screening mammogram will be mailed directly to the patient.

RECOMMENDATION:
Screening mammogram in one year. (Code:Q3-W-BC3)

BI-RADS CATEGORY  1: Negative.

## 2022-08-30 MED ORDER — SPIRONOLACTONE 25 MG PO TABS: 75.0000 mg | ORAL_TABLET | Freq: Every day | ORAL | 1 refills | Status: DC

## 2022-08-30 MED ORDER — WINLEVI 1 % EX CREA: 1.0000 "application " | TOPICAL_CREAM | Freq: Every day | CUTANEOUS | 6 refills | Status: DC

## 2022-08-30 MED ORDER — DAPSONE 7.5 % EX GEL: CUTANEOUS | 6 refills | Status: DC

## 2022-08-30 MED ORDER — DOXYCYCLINE HYCLATE 20 MG PO TABS
20.0000 mg | ORAL_TABLET | Freq: Two times a day (BID) | ORAL | 2 refills | Status: AC
Start: 2022-08-30 — End: 2022-09-29

## 2022-08-30 NOTE — Progress Notes (Signed)
   Follow-Up Visit   Subjective  Angelica Gardner is a 51 y.o. female who presents for the following: Follow-up (6 months f/u on acne, patient taking Spironolactone 25 mg 2 tablet daily, Winlevi and Dapsone gel with a good response ). The patient has spots on her arm to be evaluated, some may be new or changing.  The following portions of the chart were reviewed this encounter and updated as appropriate:   Tobacco  Allergies  Meds  Problems  Med Hx  Surg Hx  Fam Hx     Review of Systems:  No other skin or systemic complaints except as noted in HPI or Assessment and Plan.  Objective  Well appearing patient in no apparent distress; mood and affect are within normal limits.  A focused examination was performed including face, arms. Relevant physical exam findings are noted in the Assessment and Plan.  face 2 active papules on the chin   Assessment & Plan  Acne vulgaris face Chronic and persistent condition with duration or expected duration over one year. Condition is symptomatic / bothersome to patient. Not to goal.   Start Doxycyline 20 mg twice a day with food  Continue Winlevi once daily to face. Continue Dapsone once daily to face.  Continue Spironolactone '25mg'$  3 tablets daily.    Discussed increasing to Spironolactone 100 mg daily, patient prefers to stay at 75 mg daily.    BP 130/89 Pulse 89   Long term medication management.  Patient is using long term (months to years) prescription medication  to control their dermatologic condition.  These medications require periodic monitoring to evaluate for efficacy and side effects and may require periodic laboratory monitoring.   Discussed Isotretinoin a option,pt decline Isotretinoin  Related Medications doxycycline (PERIOSTAT) 20 MG tablet Take 1 tablet (20 mg total) by mouth 2 (two) times daily.  Dapsone 7.5 % GEL Apply to face once a day  Clascoterone (WINLEVI) 1 % CREA Apply 1 application  topically  daily.  spironolactone (ALDACTONE) 25 MG tablet Take 3 tablets (75 mg total) by mouth daily.  Seborrheic Keratoses - Stuck-on, waxy, tan-brown papules and/or plaques  - Benign-appearing - Discussed benign etiology and prognosis. - Observe - Call for any changes  Return in about 6 months (around 03/01/2023) for Acne .  IMarye Round, CMA, am acting as scribe for Sarina Ser, MD .  Documentation: I have reviewed the above documentation for accuracy and completeness, and I agree with the above.  Sarina Ser, MD

## 2022-08-30 NOTE — Patient Instructions (Signed)
Due to recent changes in healthcare laws, you may see results of your pathology and/or laboratory studies on MyChart before the doctors have had a chance to review them. We understand that in some cases there may be results that are confusing or concerning to you. Please understand that not all results are received at the same time and often the doctors may need to interpret multiple results in order to provide you with the best plan of care or course of treatment. Therefore, we ask that you please give us 2 business days to thoroughly review all your results before contacting the office for clarification. Should we see a critical lab result, you will be contacted sooner.   If You Need Anything After Your Visit  If you have any questions or concerns for your doctor, please call our main line at [phone] and press option 4 to reach your doctor's medical assistant. If no one answers, please leave a voicemail as directed and we will return your call as soon as possible. Messages left after 4 pm will be answered the following business day.   You may also send us a message via MyChart. We typically respond to MyChart messages within 1-2 business days.  For prescription refills, please ask your pharmacy to contact our office. Our fax number is [phone].  If you have an urgent issue when the clinic is closed that cannot wait until the next business day, you can page your doctor at the number below.    Please note that while we do our best to be available for urgent issues outside of office hours, we are not available 24/7.   If you have an urgent issue and are unable to reach us, you may choose to seek medical care at your doctor's office, retail clinic, urgent care center, or emergency room.  If you have a medical emergency, please immediately call 911 or go to the emergency department.  Pager Numbers  - Dr. Kowalski: [phone]  - Dr. Moye: [phone]  - Dr. Stewart:  [phone]  In the event of inclement weather, please call our main line at [phone] for an update on the status of any delays or closures.  Dermatology Medication Tips: Please keep the boxes that topical medications come in in order to help keep track of the instructions about where and how to use these. Pharmacies typically print the medication instructions only on the boxes and not directly on the medication tubes.   If your medication is too expensive, please contact our office at [phone] option 4 or send us a message through MyChart.   We are unable to tell what your co-pay for medications will be in advance as this is different depending on your insurance coverage. However, we may be able to find a substitute medication at lower cost or fill out paperwork to get insurance to cover a needed medication.   If a prior authorization is required to get your medication covered by your insurance company, please allow us 1-2 business days to complete this process.  Drug prices often vary depending on where the prescription is filled and some pharmacies may offer cheaper prices.  The website www.goodrx.com contains coupons for medications through different pharmacies. The prices here do not account for what the cost may be with help from insurance (it may be cheaper with your insurance), but the website can give you the price if you did not use any insurance.  - You can print the associated coupon and take it with   your prescription to the pharmacy.  - You may also stop by our office during regular business hours and pick up a GoodRx coupon card.  - If you need your prescription sent electronically to a different pharmacy, notify our office through [facility] MyChart or by phone at [phone] option 4.     Si Usted Necesita Algo Despus de Su Visita  Tambin puede enviarnos un mensaje a travs de MyChart. Por lo general respondemos a los mensajes de MyChart en el transcurso de 1 a 2  das hbiles.  Para renovar recetas, por favor pida a su farmacia que se ponga en contacto con nuestra oficina. Nuestro nmero de fax es el [phone].  Si tiene un asunto urgente cuando la clnica est cerrada y que no puede esperar hasta el siguiente da hbil, puede llamar/localizar a su doctor(a) al nmero que aparece a continuacin.   Por favor, tenga en cuenta que aunque hacemos todo lo posible para estar disponibles para asuntos urgentes fuera del horario de oficina, no estamos disponibles las 24 horas del da, los 7 das de la semana.   Si tiene un problema urgente y no puede comunicarse con nosotros, puede optar por buscar atencin mdica  en el consultorio de su doctor(a), en una clnica privada, en un centro de atencin urgente o en una sala de emergencias.  Si tiene una emergencia mdica, por favor llame inmediatamente al 911 o vaya a la sala de emergencias.  Nmeros de bper  - Dr. Kowalski: [phone]  - Dra. Moye: [phone]  - Dra. Stewart: [phone]  En caso de inclemencias del tiempo, por favor llame a nuestra lnea principal al [phone] para una actualizacin sobre el estado de cualquier retraso o cierre.  Consejos para la medicacin en dermatologa: Por favor, guarde las cajas en las que vienen los medicamentos de uso tpico para ayudarle a seguir las instrucciones sobre dnde y cmo usarlos. Las farmacias generalmente imprimen las instrucciones del medicamento slo en las cajas y no directamente en los tubos del medicamento.   Si su medicamento es muy caro, por favor, pngase en contacto con nuestra oficina llamando al [phone] y presione la opcin 4 o envenos un mensaje a travs de MyChart.   No podemos decirle cul ser su copago por los medicamentos por adelantado ya que esto es diferente dependiendo de la cobertura de su seguro. Sin embargo, es posible que podamos encontrar un medicamento sustituto a menor costo o llenar un formulario para que el  seguro cubra el medicamento que se considera necesario.   Si se requiere una autorizacin previa para que su compaa de seguros cubra su medicamento, por favor permtanos de 1 a 2 das hbiles para completar este proceso.  Los precios de los medicamentos varan con frecuencia dependiendo del lugar de dnde se surte la receta y alguna farmacias pueden ofrecer precios ms baratos.  El sitio web www.goodrx.com tiene cupones para medicamentos de diferentes farmacias. Los precios aqu no tienen en cuenta lo que podra costar con la ayuda del seguro (puede ser ms barato con su seguro), pero el sitio web puede darle el precio si no utiliz ningn seguro.  - Puede imprimir el cupn correspondiente y llevarlo con su receta a la farmacia.  - Tambin puede pasar por nuestra oficina durante el horario de atencin regular y recoger una tarjeta de cupones de GoodRx.  - Si necesita que su receta se enve electrnicamente a una farmacia diferente, informe a nuestra oficina a travs de MyChart de [place]   o por telfono llamando al [phone] y presione la opcin 4.  

## 2022-08-30 NOTE — Progress Notes (Deleted)
   Follow-Up Visit   Subjective  Addeline Calarco is a 51 y.o. female who presents for the following: Follow-up (6 months f/u on acne, patient taking Spironolactone 25 mg 2 tablet daily, Winlevi and Dapsone gel with a good response ).  ***  The following portions of the chart were reviewed this encounter and updated as appropriate:       Review of Systems:  No other skin or systemic complaints except as noted in HPI or Assessment and Plan.  Objective  Well appearing patient in no apparent distress; mood and affect are within normal limits.  {FWYO:37858::"I full examination was performed including scalp, head, eyes, ears, nose, lips, neck, chest, axillae, abdomen, back, buttocks, bilateral upper extremities, bilateral lower extremities, hands, feet, fingers, toes, fingernails, and toenails. All findings within normal limits unless otherwise noted below."}    Assessment & Plan   No follow-ups on file.    Follow-Up Visit   Subjective  Padme Arriaga is a 51 y.o. female who presents for the following: Follow-up (6 months f/u on acne, patient taking Spironolactone 25 mg 2 tablet daily, Winlevi and Dapsone gel with a good response ).  ***  The following portions of the chart were reviewed this encounter and updated as appropriate:       Review of Systems:  No other skin or systemic complaints except as noted in HPI or Assessment and Plan.  Objective  Well appearing patient in no apparent distress; mood and affect are within normal limits.  {FOYD:74128::"N full examination was performed including scalp, head, eyes, ears, nose, lips, neck, chest, axillae, abdomen, back, buttocks, bilateral upper extremities, bilateral lower extremities, hands, feet, fingers, toes, fingernails, and toenails. All findings within normal limits unless otherwise noted below."}    Assessment & Plan   No follow-ups on file.    Follow-Up Visit   Subjective  Anett Ranker is a  51 y.o. female who presents for the following: Follow-up (6 months f/u on acne, patient taking Spironolactone 25 mg 2 tablet daily, Winlevi and Dapsone gel with a good response ).  ***  The following portions of the chart were reviewed this encounter and updated as appropriate:       Review of Systems:  No other skin or systemic complaints except as noted in HPI or Assessment and Plan.  Objective  Well appearing patient in no apparent distress; mood and affect are within normal limits.  {OMVE:72094::"B full examination was performed including scalp, head, eyes, ears, nose, lips, neck, chest, axillae, abdomen, back, buttocks, bilateral upper extremities, bilateral lower extremities, hands, feet, fingers, toes, fingernails, and toenails. All findings within normal limits unless otherwise noted below."}    Assessment & Plan   No follow-ups on file.

## 2022-09-03 ENCOUNTER — Encounter: Payer: Self-pay | Admitting: Dermatology

## 2022-09-10 ENCOUNTER — Telehealth: Payer: Self-pay

## 2022-09-10 NOTE — Telephone Encounter (Signed)
Winlevi PA denied. Left VM for pt to return my call if she would like RX to go to [place]. aw

## 2022-09-11 ENCOUNTER — Other Ambulatory Visit: Payer: Self-pay

## 2022-09-11 DIAGNOSIS — L700 Acne vulgaris: Secondary | ICD-10-CM

## 2022-09-11 MED ORDER — WINLEVI 1 % EX CREA: 1.0000 "application " | TOPICAL_CREAM | Freq: Every day | CUTANEOUS | 6 refills | Status: DC

## 2022-09-11 NOTE — Progress Notes (Signed)
Winlevi switched to [employer] due to insurance denial. aw

## 2022-10-31 ENCOUNTER — Telehealth: Payer: Self-pay

## 2022-10-31 DIAGNOSIS — L700 Acne vulgaris: Secondary | ICD-10-CM

## 2022-10-31 MED ORDER — DOXYCYCLINE HYCLATE 20 MG PO TABS: ORAL_TABLET | ORAL | 1 refills | Status: DC

## 2022-10-31 NOTE — Telephone Encounter (Signed)
Refill request for 90 day supply of Doxycycline 20 mg po BID responded to electronically.

## 2022-11-29 ENCOUNTER — Other Ambulatory Visit: Payer: Self-pay | Admitting: Obstetrics and Gynecology

## 2022-11-29 DIAGNOSIS — Z1231 Encounter for screening mammogram for malignant neoplasm of breast: Secondary | ICD-10-CM

## 2022-12-05 ENCOUNTER — Ambulatory Visit
Admission: RE | Admit: 2022-12-05 | Discharge: 2022-12-05 | Disposition: A | Discharge status: Home / Self Care | Payer: BC Managed Care – PPO | Source: Ambulatory Visit | Attending: Obstetrics and Gynecology | Admitting: Obstetrics and Gynecology

## 2022-12-05 DIAGNOSIS — Z1231 Encounter for screening mammogram for malignant neoplasm of breast: Secondary | ICD-10-CM

## 2023-03-12 ENCOUNTER — Encounter: Payer: Self-pay | Admitting: Dermatology

## 2023-03-12 ENCOUNTER — Ambulatory Visit (INDEPENDENT_AMBULATORY_CARE_PROVIDER_SITE_OTHER): Payer: BC Managed Care – PPO | Admitting: Dermatology

## 2023-03-12 VITALS — BP 130/88 | HR 91

## 2023-03-12 DIAGNOSIS — Z1283 Encounter for screening for malignant neoplasm of skin: Secondary | ICD-10-CM

## 2023-03-12 DIAGNOSIS — L821 Other seborrheic keratosis: Secondary | ICD-10-CM | POA: Diagnosis not present

## 2023-03-12 DIAGNOSIS — D492 Neoplasm of unspecified behavior of bone, soft tissue, and skin: Secondary | ICD-10-CM

## 2023-03-12 DIAGNOSIS — X32XXXA Exposure to sunlight, initial encounter: Secondary | ICD-10-CM

## 2023-03-12 DIAGNOSIS — L814 Other melanin hyperpigmentation: Secondary | ICD-10-CM | POA: Diagnosis not present

## 2023-03-12 DIAGNOSIS — D1801 Hemangioma of skin and subcutaneous tissue: Secondary | ICD-10-CM

## 2023-03-12 DIAGNOSIS — Z86018 Personal history of other benign neoplasm: Secondary | ICD-10-CM

## 2023-03-12 DIAGNOSIS — Z7189 Other specified counseling: Secondary | ICD-10-CM

## 2023-03-12 DIAGNOSIS — Z808 Family history of malignant neoplasm of other organs or systems: Secondary | ICD-10-CM

## 2023-03-12 DIAGNOSIS — W908XXA Exposure to other nonionizing radiation, initial encounter: Secondary | ICD-10-CM

## 2023-03-12 DIAGNOSIS — Z79899 Other long term (current) drug therapy: Secondary | ICD-10-CM

## 2023-03-12 DIAGNOSIS — L578 Other skin changes due to chronic exposure to nonionizing radiation: Secondary | ICD-10-CM

## 2023-03-12 DIAGNOSIS — L700 Acne vulgaris: Secondary | ICD-10-CM

## 2023-03-12 MED ORDER — SPIRONOLACTONE 25 MG PO TABS: 100.0000 mg | ORAL_TABLET | Freq: Every day | ORAL | 1 refills | Status: DC

## 2023-03-12 NOTE — Patient Instructions (Addendum)
Acne: Stop Winlevi cream. Continue Dapsone once daily to face.  increase Spironolactone 25mg  to 4 tablets daily.   Spironolactone can cause increased urination and cause blood pressure to decrease. Please watch for signs of lightheadedness and be cautious when changing position. It can sometimes cause breast tenderness or an irregular period in premenopausal women. It can also increase potassium. The increase in potassium usually is not a concern unless you are taking other medicines that also increase potassium, so please be sure your doctor knows all of the other medications you are taking. This medication should not be taken by pregnant women.  This medicine should also not be taken together with sulfa drugs like Bactrim (trimethoprim/sulfamethexazole).   Wound Care Instructions  Cleanse wound gently with soap and water once a day then pat dry with clean gauze. Apply a thin coat of Petrolatum (petroleum jelly, "Vaseline") over the wound (unless you have an allergy to this). We recommend that you use a new, sterile tube of Vaseline. Do not pick or remove scabs. Do not remove the yellow or white "healing tissue" from the base of the wound.  Cover the wound with fresh, clean, nonstick gauze and secure with paper tape. You may use Band-Aids in place of gauze and tape if the wound is small enough, but would recommend trimming much of the tape off as there is often too much. Sometimes Band-Aids can irritate the skin.  You should call the office for your biopsy report after 1 week if you have not already been contacted.  If you experience any problems, such as abnormal amounts of bleeding, swelling, significant bruising, significant pain, or evidence of infection, please call the office immediately.  FOR ADULT SURGERY PATIENTS: If you need something for pain relief you may take 1 extra strength Tylenol (acetaminophen) AND 2 Ibuprofen (200mg  each) together every 4 hours as needed for pain. (do not take  these if you are allergic to them or if you have a reason you should not take them.) Typically, you may only need pain medication for 1 to 3 days.      Recommend daily broad spectrum sunscreen SPF 30+ to sun-exposed areas, reapply every 2 hours as needed. Call for new or changing lesions.  Staying in the shade or wearing long sleeves, sun glasses (UVA+UVB protection) and wide brim hats (4-inch brim around the entire circumference of the hat) are also recommended for sun protection.    Melanoma ABCDEs  Melanoma is the most dangerous type of skin cancer, and is the leading cause of death from skin disease.  You are more likely to develop melanoma if you: Have light-colored skin, light-colored eyes, or red or blond hair Spend a lot of time in the sun Tan regularly, either outdoors or in a tanning bed Have had blistering sunburns, especially during childhood Have a close family member who has had a melanoma Have atypical moles or large birthmarks  Early detection of melanoma is key since treatment is typically straightforward and cure rates are extremely high if we catch it early.   The first sign of melanoma is often a change in a mole or a new dark spot.  The ABCDE system is a way of remembering the signs of melanoma.  A for asymmetry:  The two halves do not match. B for border:  The edges of the growth are irregular. C for color:  A mixture of colors are present instead of an even brown color. D for diameter:  Melanomas are usually (  but not always) greater than 6mm - the size of a pencil eraser. E for evolution:  The spot keeps changing in size, shape, and color.  Please check your skin once per month between visits. You can use a small mirror in front and a large mirror behind you to keep an eye on the back side or your body.   If you see any new or changing lesions before your next follow-up, please call to schedule a visit.  Please continue daily skin protection including broad  spectrum sunscreen SPF 30+ to sun-exposed areas, reapplying every 2 hours as needed when you're outdoors.   Staying in the shade or wearing long sleeves, sun glasses (UVA+UVB protection) and wide brim hats (4-inch brim around the entire circumference of the hat) are also recommended for sun protection.    Due to recent changes in healthcare laws, you may see results of your pathology and/or laboratory studies on MyChart before the doctors have had a chance to review them. We understand that in some cases there may be results that are confusing or concerning to you. Please understand that not all results are received at the same time and often the doctors may need to interpret multiple results in order to provide you with the best plan of care or course of treatment. Therefore, we ask that you please give [place] 2 business days to thoroughly review all your results before contacting the office for clarification. Should we see a critical lab result, you will be contacted sooner.   If You Need Anything After Your Visit  If you have any questions or concerns for your doctor, please call our main line at [phone] and press option 4 to reach your doctor's medical assistant. If no one answers, please leave a voicemail as directed and we will return your call as soon as possible. Messages left after 4 pm will be answered the following business day.   You may also send [place] a message via MyChart. We typically respond to MyChart messages within 1-2 business days.  For prescription refills, please ask your pharmacy to contact our office. Our fax number is [phone].  If you have an urgent issue when the clinic is closed that cannot wait until the next business day, you can page your doctor at the number below.    Please note that while we do our best to be available for urgent issues outside of office hours, we are not available 24/7.   If you have an urgent issue and are unable to reach [place], you may choose  to seek medical care at your doctor's office, retail clinic, urgent care center, or emergency room.  If you have a medical emergency, please immediately call 911 or go to the emergency department.  Pager Numbers  - Dr. Gwen Pounds: [phone]  - Dr. Neale Burly: [phone]  - Dr. Roseanne Reno: [phone]  In the event of inclement weather, please call our main line at [phone] for an update on the status of any delays or closures.  Dermatology Medication Tips: Please keep the boxes that topical medications come in in order to help keep track of the instructions about where and how to use these. Pharmacies typically print the medication instructions only on the boxes and not directly on the medication tubes.   If your medication is too expensive, please contact our office at [phone] option 4 or send [place] a message through MyChart.   We are unable to tell what your co-pay for medications will be in  advance as this is different depending on your insurance coverage. However, we may be able to find a substitute medication at lower cost or fill out paperwork to get insurance to cover a needed medication.   If a prior authorization is required to get your medication covered by your insurance company, please allow [place] 1-2 business days to complete this process.  Drug prices often vary depending on where the prescription is filled and some pharmacies may offer cheaper prices.  The website www.goodrx.com contains coupons for medications through different pharmacies. The prices here do not account for what the cost may be with help from insurance (it may be cheaper with your insurance), but the website can give you the price if you did not use any insurance.  - You can print the associated coupon and take it with your prescription to the pharmacy.  - You may also stop by our office during regular business hours and pick up a GoodRx coupon card.  - If you need your prescription sent electronically to a  different pharmacy, notify our office through [facility] or by phone at [phone] option 4.     Si Usted Necesita Algo Despus de Su Visita  Tambin puede enviarnos un mensaje a travs de Clinical cytogeneticist. Por lo general respondemos a los mensajes de MyChart en el transcurso de 1 a 2 das hbiles.  Para renovar recetas, por favor pida a su farmacia que se ponga en contacto con nuestra oficina. Annie Sable de fax es [place] [phone].  Si tiene un asunto urgente cuando la clnica est cerrada y que no puede esperar hasta el siguiente da hbil, puede llamar/localizar a su doctor(a) al nmero que aparece a continuacin.   Por favor, tenga en cuenta que aunque hacemos todo lo posible para estar disponibles para asuntos urgentes fuera del horario de [place], no estamos disponibles las 24 horas del da, los 7 [address]  [address] de la [place].   Si tiene un problema urgente y no puede comunicarse con nosotros, puede optar por buscar atencin mdica  en el consultorio de su doctor(a), en una clnica privada, en un centro de atencin urgente o en una sala de emergencias.  Si tiene Engineer, drilling, por favor llame inmediatamente al 911 o vaya a la sala de emergencias.  Nmeros de bper  - Dr. Gwen Pounds: [phone]  - Dra. Moye: [phone]  - Dra. Roseanne Reno: [phone]  En caso de inclemencias del [place], por favor llame a Lacy Duverney principal al [phone] para una actualizacin sobre el [place] de cualquier retraso o cierre.  Consejos para la medicacin en dermatologa: Por favor, guarde las cajas en las que vienen los medicamentos de uso tpico para ayudarle a seguir las instrucciones sobre dnde y cmo usarlos. Las farmacias generalmente imprimen las instrucciones del medicamento slo en las cajas y no directamente en los tubos del [place].   Si su medicamento es muy caro, por favor, pngase en contacto con Rolm Gala llamando al [phone] y presione la opcin 4 o envenos un  mensaje a travs de Clinical cytogeneticist.   No podemos decirle cul ser su copago por los medicamentos por adelantado ya que esto es diferente dependiendo de la cobertura de su seguro. Sin embargo, es posible que podamos encontrar un medicamento sustituto a Audiological scientist un formulario para que el seguro cubra el medicamento que se considera necesario.   Si se requiere una autorizacin previa para que su compaa de seguros [place] su medicamento, por favor permtanos de 1 a 2  das hbiles para completar [place].  Los precios de los medicamentos varan con frecuencia dependiendo del Environmental consultant de dnde se surte la receta y alguna farmacias pueden ofrecer precios ms baratos.  El sitio web www.goodrx.com tiene cupones para medicamentos de Airline pilot. Los precios aqu no tienen en cuenta lo que podra costar con la ayuda del seguro (puede ser ms barato con su seguro), pero el sitio web puede darle el precio si no utiliz Research scientist (physical sciences).  - Puede imprimir el cupn correspondiente y llevarlo con su receta a la farmacia.  - Tambin puede pasar por nuestra oficina durante el horario de atencin regular y Charity fundraiser una tarjeta de cupones de GoodRx.  - Si necesita que su receta se enve electrnicamente a una farmacia diferente, informe a nuestra oficina a travs de MyChart de [place] o por telfono llamando al [phone] y presione la opcin 4.

## 2023-03-12 NOTE — Progress Notes (Unsigned)
Follow-Up Visit   Subjective  Angelica Gardner is a 52 y.o. female who presents for the following: Skin Cancer Screening and Full Body Skin Exam. Hx of DN. Brother recently diagnosed with melanoma.  6 month acne follow up. Using Winlevi, Dapsone 5% and taking Spironolactone 25 mg 3 tablets daily, controlling well.  The patient presents for Total-Body Skin Exam (TBSE) for skin cancer screening and mole check. The patient has spots, moles and lesions to be evaluated, some may be new or changing and the patient has concerns that these could be cancer.  The following portions of the chart were reviewed this encounter and updated as appropriate: medications, allergies, medical history  Review of Systems:  No other skin or systemic complaints except as noted in HPI or Assessment and Plan.  Objective  Well appearing patient in no apparent distress; mood and affect are within normal limits.  A full examination was performed including scalp, head, eyes, ears, nose, lips, neck, chest, axillae, abdomen, back, buttocks, bilateral upper extremities, bilateral lower extremities, hands, feet, fingers, toes, fingernails, and toenails. All findings within normal limits unless otherwise noted below.   Relevant physical exam findings are noted in the Assessment and Plan.  Right Ear, superior helix 0.7 cm brown macule      Assessment & Plan   HISTORY OF DYSPLASTIC NEVUS. Left superior buttocks. Mild atypia. 12/26/2015 No evidence of recurrence today Recommend regular full body skin exams Recommend daily broad spectrum sunscreen SPF 30+ to sun-exposed areas, reapply every 2 hours as needed.  Call if any new or changing lesions are noted between office visits   LENTIGINES, SEBORRHEIC KERATOSES, HEMANGIOMAS - Benign normal skin lesions - Benign-appearing - Call for any changes  MELANOCYTIC NEVI - Tan-brown and/or pink-flesh-colored symmetric macules and papules - Benign appearing on exam  today - Observation - Call clinic for new or changing moles - Recommend daily use of broad spectrum spf 30+ sunscreen to sun-exposed areas.   ACTINIC DAMAGE - Chronic condition, secondary to cumulative UV/sun exposure - diffuse scaly erythematous macules with underlying dyspigmentation - Recommend daily broad spectrum sunscreen SPF 30+ to sun-exposed areas, reapply every 2 hours as needed.  - Staying in the shade or wearing long sleeves, sun glasses (UVA+UVB protection) and wide brim hats (4-inch brim around the entire circumference of the hat) are also recommended for sun protection.  - Call for new or changing lesions.  FAMILY HISTORY OF SKIN CANCER What type(s): Melanoma Who affected: Brother   SKIN CANCER SCREENING PERFORMED TODAY.  ACNE VULGARIS Exam: 4-5 small papules Chronic and persistent condition with duration or expected duration over one year. Condition is symptomatic/ bothersome to patient. Not currently at goal. Treatment Plan: Stop Winlevi cream. Continue Dapsone once daily to face.  increase Spironolactone 25mg  to 4 tablets daily.   Spironolactone can cause increased urination and cause blood pressure to decrease. Please watch for signs of lightheadedness and be cautious when changing position. It can sometimes cause breast tenderness or an irregular period in premenopausal women. It can also increase potassium. The increase in potassium usually is not a concern unless you are taking other medicines that also increase potassium, so please be sure your doctor knows all of the other medications you are taking. This medication should not be taken by pregnant women.  This medicine should also not be taken together with sulfa drugs like Bactrim (trimethoprim/sulfamethexazole).   Defers low dose Doxycycline today, causes GI issues.   Neoplasm of skin Right Ear, superior  helix  Skin / nail biopsy Type of biopsy: tangential   Informed consent: discussed and consent obtained    Timeout: patient name, date of birth, surgical site, and procedure verified   Procedure prep:  Patient was prepped and draped in usual sterile fashion Prep type:  Isopropyl alcohol Anesthesia: the lesion was anesthetized in a standard fashion   Anesthetic:  1% lidocaine w/ epinephrine 1-100,000 buffered w/ 8.4% NaHCO3 Instrument used: flexible razor blade   Hemostasis achieved with: pressure, aluminum chloride and electrodesiccation   Outcome: patient tolerated procedure well   Post-procedure details: sterile dressing applied and wound care instructions given   Dressing type: bandage and petrolatum    Specimen 1 - Surgical pathology Differential Diagnosis: nevus vs dysplastic nevus vs irregular SK  Check Margins: No  Acne vulgaris  Related Medications Dapsone 7.5 % GEL Apply to face once a day  Clascoterone (WINLEVI) 1 % CREA Apply 1 application  topically daily.  doxycycline (PERIOSTAT) 20 MG tablet Take one tab po BID with food.   Return in about 1 year (around 03/11/2024) for TBSE, Acne Follow Up in 6 months.  I, Lawson Radar, CMA, am acting as scribe for Armida Sans, MD.  Documentation: I have reviewed the above documentation for accuracy and completeness, and I agree with the above.  Armida Sans, MD

## 2023-03-14 ENCOUNTER — Encounter: Payer: Self-pay | Admitting: Dermatology

## 2023-03-19 ENCOUNTER — Telehealth: Payer: Self-pay

## 2023-03-19 NOTE — Telephone Encounter (Signed)
-----   Message from Deirdre Evener, MD sent at 03/19/2023 11:17 AM EDT ----- Diagnosis Skin , right ear, superior helix PIGMENTED SEBORRHEIC KERATOSIS, EARLY  Benign keratosis No further treatment needed

## 2023-03-19 NOTE — Telephone Encounter (Signed)
LM on VM please return my call to discuss biopsy results  

## 2023-03-20 ENCOUNTER — Telehealth: Payer: Self-pay

## 2023-03-20 NOTE — Telephone Encounter (Signed)
Patient informed of pathology results 

## 2023-03-20 NOTE — Telephone Encounter (Signed)
-----   Message from David C Kowalski, MD sent at 03/19/2023 11:17 AM EDT ----- Diagnosis Skin , right ear, superior helix PIGMENTED SEBORRHEIC KERATOSIS, EARLY  Benign keratosis No further treatment needed 

## 2023-04-30 ENCOUNTER — Other Ambulatory Visit: Payer: Self-pay

## 2023-04-30 MED ORDER — SPIRONOLACTONE 25 MG PO TABS: 100.0000 mg | ORAL_TABLET | Freq: Every day | ORAL | 1 refills | Status: DC

## 2023-04-30 MED ORDER — TRI-LUMA 0.01-4-0.05 % EX CREA: TOPICAL_CREAM | CUTANEOUS | 0 refills | Status: DC

## 2023-05-06 ENCOUNTER — Other Ambulatory Visit: Payer: Self-pay | Admitting: Dermatology

## 2023-05-06 DIAGNOSIS — L700 Acne vulgaris: Secondary | ICD-10-CM

## 2023-09-04 DIAGNOSIS — Z780 Asymptomatic menopausal state: Secondary | ICD-10-CM | POA: Diagnosis not present

## 2023-09-04 DIAGNOSIS — J309 Allergic rhinitis, unspecified: Secondary | ICD-10-CM | POA: Diagnosis not present

## 2023-09-04 DIAGNOSIS — L709 Acne, unspecified: Secondary | ICD-10-CM | POA: Diagnosis not present

## 2023-09-12 ENCOUNTER — Ambulatory Visit (INDEPENDENT_AMBULATORY_CARE_PROVIDER_SITE_OTHER): Payer: BC Managed Care – PPO | Admitting: Dermatology

## 2023-09-12 DIAGNOSIS — L578 Other skin changes due to chronic exposure to nonionizing radiation: Secondary | ICD-10-CM

## 2023-09-12 DIAGNOSIS — L811 Chloasma: Secondary | ICD-10-CM

## 2023-09-12 DIAGNOSIS — Z1283 Encounter for screening for malignant neoplasm of skin: Secondary | ICD-10-CM | POA: Diagnosis not present

## 2023-09-12 DIAGNOSIS — L821 Other seborrheic keratosis: Secondary | ICD-10-CM

## 2023-09-12 DIAGNOSIS — D1801 Hemangioma of skin and subcutaneous tissue: Secondary | ICD-10-CM

## 2023-09-12 DIAGNOSIS — L814 Other melanin hyperpigmentation: Secondary | ICD-10-CM

## 2023-09-12 DIAGNOSIS — L700 Acne vulgaris: Secondary | ICD-10-CM

## 2023-09-12 DIAGNOSIS — W908XXA Exposure to other nonionizing radiation, initial encounter: Secondary | ICD-10-CM | POA: Diagnosis not present

## 2023-09-12 DIAGNOSIS — D229 Melanocytic nevi, unspecified: Secondary | ICD-10-CM

## 2023-09-12 DIAGNOSIS — Z7189 Other specified counseling: Secondary | ICD-10-CM

## 2023-09-12 DIAGNOSIS — Z808 Family history of malignant neoplasm of other organs or systems: Secondary | ICD-10-CM

## 2023-09-12 DIAGNOSIS — Z79899 Other long term (current) drug therapy: Secondary | ICD-10-CM

## 2023-09-12 DIAGNOSIS — Z86018 Personal history of other benign neoplasm: Secondary | ICD-10-CM

## 2023-09-12 MED ORDER — DAPSONE 7.5 % EX GEL
CUTANEOUS | 5 refills | Status: DC
Start: 2023-09-12 — End: 2024-03-11

## 2023-09-12 MED ORDER — SPIRONOLACTONE 100 MG PO TABS: 100.0000 mg | ORAL_TABLET | Freq: Every day | ORAL | 3 refills | Status: DC

## 2023-09-12 MED ORDER — TRI-LUMA 0.01-4-0.05 % EX CREA: TOPICAL_CREAM | CUTANEOUS | 1 refills | Status: DC

## 2023-09-12 NOTE — Progress Notes (Signed)
Follow-Up Visit   Subjective  Angelica Gardner is a 52 y.o. female who presents for the following: Skin Cancer Screening and Full Body Skin Exam  The patient presents for Total-Body Skin Exam (TBSE) for skin cancer screening and mole check. The patient has spots, moles and lesions to be evaluated, some may be new or changing and the patient may have concern these could be cancer.  Patient with hx of dysplastic nevus. Patient using dapsone and taking 100 mg of spironolactone daily for acne, uses Tri-luma for dark spots. No side effects.  The following portions of the chart were reviewed this encounter and updated as appropriate: medications, allergies, medical history  Review of Systems:  No other skin or systemic complaints except as noted in HPI or Assessment and Plan.  Objective  Well appearing patient in no apparent distress; mood and affect are within normal limits.  A full examination was performed including scalp, head, eyes, ears, nose, lips, neck, chest, axillae, abdomen, back, buttocks, bilateral upper extremities, bilateral lower extremities, hands, feet, fingers, toes, fingernails, and toenails. All findings within normal limits unless otherwise noted below.   Relevant physical exam findings are noted in the Assessment and Plan.    Assessment & Plan   SKIN CANCER SCREENING PERFORMED TODAY.  ACTINIC DAMAGE - Chronic condition, secondary to cumulative UV/sun exposure - diffuse scaly erythematous macules with underlying dyspigmentation - Recommend daily broad spectrum sunscreen SPF 30+ to sun-exposed areas, reapply every 2 hours as needed.  - Staying in the shade or wearing long sleeves, sun glasses (UVA+UVB protection) and wide brim hats (4-inch brim around the entire circumference of the hat) are also recommended for sun protection.  - Call for new or changing lesions.  LENTIGINES, SEBORRHEIC KERATOSES, HEMANGIOMAS - Benign normal skin lesions - Benign-appearing -  Call for any changes  MELANOCYTIC NEVI - Tan-brown and/or pink-flesh-colored symmetric macules and papules - Benign appearing on exam today - Observation - Call clinic for new or changing moles - Recommend daily use of broad spectrum spf 30+ sunscreen to sun-exposed areas.   HISTORY OF DYSPLASTIC NEVUS. Left superior buttocks. Mild atypia. 12/26/2015 No evidence of recurrence today Recommend regular full body skin exams Recommend daily broad spectrum sunscreen SPF 30+ to sun-exposed areas, reapply every 2 hours as needed.  Call if any new or changing lesions are noted between office visits    FAMILY HISTORY OF SKIN CANCER What type(s): melanoma Who affected: brother  ACNE VULGARIS Exam: inflammatory papule at chin  Chronic and persistent condition with duration or expected duration over one year. Condition is symptomatic/ bothersome to patient. Not currently at goal.   Treatment Plan: Continue spironolactone 100 mg every day Continue dapsone to face every day  MELASMA Exam: hyperpigmented patches at cheeks  Chronic and persistent condition with duration or expected duration over one year. Condition is symptomatic/ bothersome to patient. Not currently at goal.   Melasma is a chronic; persistent condition of hyperpigmented patches generally on the face, worse in summer due to higher UV exposure.    Heredity; thyroid disease; sun exposure; pregnancy; birth control pills; epilepsy medication and darker skin may predispose to Melasma.   Recommendations include: - Sun avoidance and daily broad spectrum (UVA/UVB) tinted mineral sunscreen SPF 30+, with Zinc or Titanium Dioxide. - Rx topical bleaching creams (i.e. hydroquinone) is a common treatment but should not be used long term.  Hydroquinones may be mixed with retinoids; vitamin C; steroids; Kojic Acid. - Alastin A-luminate, retinoids, vitamin C,  topical tranexamic acid, glycolic acid and kojic acid can be used for brightening  while on break from hydroquinone - Rx Azelaic Acid is also a treatment option that is safe for pregnancy (Category B). - OTC Heliocare can be helpful in control and prevention. - Oral Rx with Tranexamic Acid 250 mg - 650 mg po daily can be used for moderate to severe cases especially during summer (contraindications include pregnancy; lactation; hx of PE; hx of DVT; clotting disorder; heart disease; anticoagulant use and upcoming long trips)   - Chemical peels (would need multiple for best result).  - Lasers and  Microdermabrasion may also be helpful adjunct treatments.  Treatment Plan: Continue Tri-Luma at bedtime to aa face up to 3 months.  Patient prefers Tri-Luma > Skin Medicinals hydroquinone   Acne vulgaris  Related Medications Clascoterone (WINLEVI) 1 % CREA Apply 1 application  topically daily.  doxycycline (PERIOSTAT) 20 MG tablet Take one tab po BID with food.  Dapsone 7.5 % GEL Apply to face once a day   Return in about 1 year (around 09/11/2024) for TBSE, with Dr. Kirtland Bouchard, Fhx MM, Hx Dysplastic Nevi, acne.  Anise Salvo, RMA, am acting as scribe for Armida Sans, MD .   Documentation: I have reviewed the above documentation for accuracy and completeness, and I agree with the above.  Armida Sans, MD

## 2023-09-12 NOTE — Patient Instructions (Addendum)
Continue Tri-Luma to hyperpigmented areas at face for up to 3 months.  Treatment Plan: Continue spironolactone 100 mg every day Continue dapsone to face every day  Spironolactone can cause increased urination and cause blood pressure to decrease. Please watch for signs of lightheadedness and be cautious when changing position. It can sometimes cause breast tenderness or an irregular period in premenopausal women. It can also increase potassium. The increase in potassium usually is not a concern unless you are taking other medicines that also increase potassium, so please be sure your doctor knows all of the other medications you are taking. This medication should not be taken by pregnant women.  This medicine should also not be taken together with sulfa drugs like Bactrim (trimethoprim/sulfamethexazole).   Melanoma ABCDEs  Melanoma is the most dangerous type of skin cancer, and is the leading cause of death from skin disease.  You are more likely to develop melanoma if you: Have light-colored skin, light-colored eyes, or red or blond hair Spend a lot of time in the sun Tan regularly, either outdoors or in a tanning bed Have had blistering sunburns, especially during childhood Have a close family member who has had a melanoma Have atypical moles or large birthmarks  Early detection of melanoma is key since treatment is typically straightforward and cure rates are extremely high if we catch it early.   The first sign of melanoma is often a change in a mole or a new dark spot.  The ABCDE system is a way of remembering the signs of melanoma.  A for asymmetry:  The two halves do not match. B for border:  The edges of the growth are irregular. C for color:  A mixture of colors are present instead of an even brown color. D for diameter:  Melanomas are usually (but not always) greater than 6mm - the size of a pencil eraser. E for evolution:  The spot keeps changing in size, shape, and  color.  Please check your skin once per month between visits. You can use a small mirror in front and a large mirror behind you to keep an eye on the back side or your body.   If you see any new or changing lesions before your next follow-up, please call to schedule a visit.  Please continue daily skin protection including broad spectrum sunscreen SPF 30+ to sun-exposed areas, reapplying every 2 hours as needed when you're outdoors.    Due to recent changes in healthcare laws, you may see results of your pathology and/or laboratory studies on MyChart before the doctors have had a chance to review them. We understand that in some cases there may be results that are confusing or concerning to you. Please understand that not all results are received at the same time and often the doctors may need to interpret multiple results in order to provide you with the best plan of care or course of treatment. Therefore, we ask that you please give [place] 2 business days to thoroughly review all your results before contacting the office for clarification. Should we see a critical lab result, you will be contacted sooner.   If You Need Anything After Your Visit  If you have any questions or concerns for your doctor, please call our main line at [phone] and press option 4 to reach your doctor's medical assistant. If no one answers, please leave a voicemail as directed and we will return your call as soon as possible. Messages left after 4 pm  will be answered the following business day.   You may also send [place] a message via MyChart. We typically respond to MyChart messages within 1-2 business days.  For prescription refills, please ask your pharmacy to contact our office. Our fax number is [phone].  If you have an urgent issue when the clinic is closed that cannot wait until the next business day, you can page your doctor at the number below.    Please note that while we do our best to be available for  urgent issues outside of office hours, we are not available 24/7.   If you have an urgent issue and are unable to reach [place], you may choose to seek medical care at your doctor's office, retail clinic, urgent care center, or emergency room.  If you have a medical emergency, please immediately call 911 or go to the emergency department.  Pager Numbers  - Dr. Gwen Pounds: [phone]  - Dr. Roseanne Reno: [phone]  - Dr. Katrinka Blazing: [phone]   In the event of inclement weather, please call our main line at [phone] for an update on the status of any delays or closures.  Dermatology Medication Tips: Please keep the boxes that topical medications come in in order to help keep track of the instructions about where and how to use these. Pharmacies typically print the medication instructions only on the boxes and not directly on the medication tubes.   If your medication is too expensive, please contact our office at [phone] option 4 or send [place] a message through MyChart.   We are unable to tell what your co-pay for medications will be in advance as this is different depending on your insurance coverage. However, we may be able to find a substitute medication at lower cost or fill out paperwork to get insurance to cover a needed medication.   If a prior authorization is required to get your medication covered by your insurance company, please allow [place] 1-2 business days to complete this process.  Drug prices often vary depending on where the prescription is filled and some pharmacies may offer cheaper prices.  The website www.goodrx.com contains coupons for medications through different pharmacies. The prices here do not account for what the cost may be with help from insurance (it may be cheaper with your insurance), but the website can give you the price if you did not use any insurance.  - You can print the associated coupon and take it with your prescription to the pharmacy.  - You may also  stop by our office during regular business hours and pick up a GoodRx coupon card.  - If you need your prescription sent electronically to a different pharmacy, notify our office through [facility] or by phone at [phone] option 4.     Si Usted Necesita Algo Despus de Su Visita  Tambin puede enviarnos un mensaje a travs de Clinical cytogeneticist. Por lo general respondemos a los mensajes de MyChart en el transcurso de 1 a 2 das hbiles.  Para renovar recetas, por favor pida a su farmacia que se ponga en contacto con nuestra oficina. Annie Sable de fax es [place] [phone].  Si tiene un asunto urgente cuando la clnica est cerrada y que no puede esperar hasta el siguiente da hbil, puede llamar/localizar a su doctor(a) al nmero que aparece a continuacin.   Por favor, tenga en cuenta que aunque hacemos todo lo posible para estar disponibles para asuntos urgentes fuera del horario de oficina, no estamos disponibles las 24  horas del Futures trader, los 7 [address]  [address] de la [place].   Si tiene un problema urgente y no puede comunicarse con nosotros, puede optar por buscar atencin mdica  en el consultorio de su doctor(a), en una clnica privada, en un centro de atencin urgente o en una sala de emergencias.  Si tiene Engineer, drilling, por favor llame inmediatamente al 911 o vaya a la sala de emergencias.  Nmeros de bper  - Dr. Gwen Pounds: [phone]  - Dra. Roseanne Reno: [phone]  - Dr. Katrinka Blazing: [phone]   En caso de inclemencias del tiempo, por favor llame a Lacy Duverney principal al [phone] para una actualizacin sobre el [place] de cualquier retraso o cierre.  Consejos para la medicacin en dermatologa: Por favor, guarde las cajas en las que vienen los medicamentos de uso tpico para ayudarle a seguir las instrucciones sobre dnde y cmo usarlos. Las farmacias generalmente imprimen las instrucciones del medicamento slo en las cajas y no directamente en los tubos del [place].    Si su medicamento es muy caro, por favor, pngase en contacto con Rolm [place]ala llamando al [phone] y presione la opcin 4 o envenos un mensaje a travs de Clinical cytogeneticist.   No podemos decirle cul ser su copago por los medicamentos por adelantado ya que esto es diferente dependiendo de la cobertura de su seguro. Sin embargo, es posible que podamos encontrar un medicamento sustituto a Audiological scientist un formulario para que el seguro cubra el medicamento que se considera necesario.   Si se requiere una autorizacin previa para que su compaa de seguros [place] su medicamento, por favor permtanos de 1 a 2 das hbiles para completar [address]  Los precios de los medicamentos varan con frecuencia dependiendo del Environmental consultant de dnde se surte la receta y alguna farmacias pueden ofrecer precios ms baratos.  El sitio web www.goodrx.com tiene cupones para medicamentos de Health and safety inspector. Los precios aqu no tienen en cuenta lo que podra costar con la ayuda del seguro (puede ser ms barato con su seguro), pero el sitio web puede darle el precio si no utiliz Tourist information centre manager.  - Puede imprimir el cupn correspondiente y llevarlo con su receta a la farmacia.  - Tambin puede pasar por nuestra oficina durante el horario de atencin regular y Education officer, museum una tarjeta de cupones de GoodRx.  - Si necesita que su receta se enve electrnicamente a una farmacia diferente, informe a nuestra oficina a travs de MyChart de [place] o por telfono llamando al [phone] y presione la opcin 4.

## 2023-09-21 ENCOUNTER — Encounter: Payer: Self-pay | Admitting: Dermatology

## 2023-12-17 NOTE — Progress Notes (Unsigned)
PCP: Patient, No Pcp Per   No chief complaint on file.   HPI:      [name] is a 53 y.o. [id number] whose LMP was No LMP recorded. (Menstrual status: IUD)., presents today for her annual examination.  Her menses are monthly with IUD, spotting 2-3 days, no BTB, no dysmen. IUD placed for menorrhagia in past and this is her 3rd one. Would rather not have it removed since she is perimenopausal. Headaches with OCPs in past.  Sex activity: single partner, contraception - IUD. Mirena placed 11/01/14. Pt states IUD placement confirmed in past because strings not seen in cx os. She does have vaginal dryness improved with lubricants.   Last Pap: 03/26/22 Results were: no abnormalities /neg HPV DNA   Last mammogram: 12/05/22 Results were: normal--routine follow-up in 12 months There is no FH of breast cancer. There is no FH of ovarian cancer. The patient does do self-breast exams.  Colonoscopy: NEG cologuard 5/21; repeat due after 3 years.   Tobacco use: The patient denies current or previous tobacco use. Alcohol use: none No drug use Exercise: min active  She does not get adequate calcium but does get Vitamin D in her diet.  Labs with PCP.   Patient Active Problem List   Diagnosis Date Noted   IUD strings lost 01/17/2017   Acne 01/17/2017    Past Surgical History:  Procedure Laterality Date   NASAL SINUS SURGERY  2003   TONSILLECTOMY      Family History  Problem Relation Age of Onset   Hypercholesterolemia Mother    Hypertension Mother    Asthma Father    Hypertension Father    Breast cancer Neg Hx     Social History   Socioeconomic History   Marital status: Married    Spouse name: Not on file   Number of children: 2   Years of education: 16   Highest education level: Not on file  Occupational History   Occupation: Finance administration  Tobacco Use   Smoking status: Never   Smokeless tobacco: Never  Vaping Use   Vaping status: Never Used   Substance and Sexual Activity   Alcohol use: No   Drug use: No   Sexual activity: Yes    Birth control/protection: I.U.D.    Comment: Mirena  Other Topics Concern   Not on file  Social History Narrative   Not on file   Social Drivers of Health   Financial Resource Strain: Not on file  Food Insecurity: Not on file  Transportation Needs: Not on file  Physical Activity: Not on file  Stress: Not on file  Social Connections: Not on file  Intimate Partner Violence: Not on file     Current Outpatient Medications:    Biotin 1 MG CAPS, Take by mouth., Disp: , Rfl:    cetirizine (ZYRTEC) 10 MG tablet, Take 1 tablet by mouth daily., Disp: , Rfl:    Cholecalciferol 25 MCG (1000 UT) tablet, Take by mouth., Disp: , Rfl:    Clascoterone (WINLEVI) 1 % CREA, Apply 1 application  topically daily., Disp: 60 g, Rfl: 6   Dapsone 7.5 % GEL, Apply to face once a day, Disp: 60 g, Rfl: 5   doxycycline (PERIOSTAT) 20 MG tablet, Take one tab po BID with food., Disp: 180 tablet, Rfl: 1   Fluocin-Hydroquinone-Tretinoin (TRI-LUMA) 0.01-4-0.05 % CREA, APPLY A SMALL AMOUNT TO AFFECTED AREA AT BEDTIME, Disp: 30 g, Rfl: 1   fluticasone (FLONASE) 50 MCG/ACT  nasal spray, Place 2 sprays into both nostrils daily., Disp: , Rfl:    levonorgestrel (MIRENA) 20 MCG/24HR IUD, 1 each by Intrauterine route once., Disp: , Rfl:    Omega-3 Fatty Acids (FISH OIL PO), Take by mouth., Disp: , Rfl:    spironolactone (ALDACTONE) 100 MG tablet, Take 1 tablet (100 mg total) by mouth daily., Disp: 90 tablet, Rfl: 3   spironolactone (ALDACTONE) 25 MG tablet, Take 4 tablets (100 mg total) by mouth daily., Disp: 360 tablet, Rfl: 1   vitamin B-12 (CYANOCOBALAMIN) 1000 MCG tablet, Take by mouth., Disp: , Rfl:      ROS:  Review of Systems  Constitutional:  Negative for fatigue, fever and unexpected weight change.  Respiratory:  Negative for cough, shortness of breath and wheezing.   Cardiovascular:  Negative for chest pain,  palpitations and leg swelling.  Gastrointestinal:  Negative for blood in stool, constipation, diarrhea, nausea and vomiting.  Endocrine: Negative for cold intolerance, heat intolerance and polyuria.  Genitourinary:  Positive for dyspareunia. Negative for dysuria, flank pain, frequency, genital sores, hematuria, menstrual problem, pelvic pain, urgency, vaginal bleeding, vaginal discharge and vaginal pain.  Musculoskeletal:  Negative for back pain, joint swelling and myalgias.  Skin:  Negative for rash.  Neurological:  Negative for dizziness, syncope, light-headedness, numbness and headaches.  Hematological:  Negative for adenopathy.  Psychiatric/Behavioral:  Negative for agitation, confusion, sleep disturbance and suicidal ideas. The patient is not nervous/anxious.    BREAST: No symptoms    Objective: There were no vitals taken for this visit.   Physical Exam Constitutional:      Appearance: She is well-developed.  Genitourinary:     Vulva normal.     Genitourinary Comments: IUD STRINGS NOT IN CX OS     Right Labia: No rash, tenderness or lesions.    Left Labia: No tenderness, lesions or rash.    No vaginal discharge, erythema or tenderness.      Right Adnexa: not tender and no mass present.    Left Adnexa: not tender and no mass present.    Cervical polyp present.     No cervical friability.     No IUD strings visualized.        Uterus is not enlarged or tender.  Breasts:    Right: No mass, nipple discharge, skin change or tenderness.     Left: No mass, nipple discharge, skin change or tenderness.  Neck:     Thyroid: No thyromegaly.  Cardiovascular:     Rate and Rhythm: Normal rate and regular rhythm.     Heart sounds: Normal heart sounds. No murmur heard. Pulmonary:     Effort: Pulmonary effort is normal.     Breath sounds: Normal breath sounds.  Abdominal:     Palpations: Abdomen is soft.     Tenderness: There is no abdominal tenderness. There is no guarding or  rebound.  Musculoskeletal:        General: Normal range of motion.     Cervical back: Normal range of motion.  Lymphadenopathy:     Cervical: No cervical adenopathy.  Neurological:     General: No focal deficit present.     Mental Status: She is alert and oriented to person, place, and time.     Cranial Nerves: No cranial nerve deficit.  Skin:    General: Skin is warm and dry.  Psychiatric:        Mood and Affect: Mood normal.        Behavior: Behavior  normal.        Thought Content: Thought content normal.        Judgment: Judgment normal.  Vitals reviewed.     Cervix visualized and polyp noted.  Ring forcep applied to cervix and twisting motion removed polyp intact.  Hemostasis obtained.   Assessment/Plan:  Encounter for annual routine gynecological examination  Cervical cancer screening - Plan: Cytology - PAP  Screening for HPV (human papillomavirus) - Plan: Cytology - PAP  Encounter for routine checking of intrauterine contraceptive device (IUD); IUD strings not in cx os but confirmed on u/s in past per pt. Pt is perimenopausal, spotting monthly. Discussed she can leave in longer than 8 yrs since contraception not an issue. Will eval bleeding next yr or sooner prn. Pt would rather not have IUD replacement given her age if she can keep bleeding under control.   Encounter for screening mammogram for malignant neoplasm of breast - Plan: MM 3D SCREEN BREAST BILATERAL; pt to schedule mammo for 1/24  Endocervical polyp - Plan: Surgical pathology; 2 polyps removed, sent to path.           GYN counsel breast self exam, mammography screening, menopause, adequate intake of calcium and vitamin D, diet and exercise    F/U  No follow-ups on file.  Kassi Esteve B. Saw Mendenhall, PA-C 12/17/2023 12:25 PM

## 2023-12-19 ENCOUNTER — Encounter: Payer: Self-pay | Admitting: Obstetrics and Gynecology

## 2023-12-19 ENCOUNTER — Ambulatory Visit (INDEPENDENT_AMBULATORY_CARE_PROVIDER_SITE_OTHER): Payer: BC Managed Care – PPO | Admitting: Obstetrics and Gynecology

## 2023-12-19 VITALS — BP 123/81 | HR 109 | Ht 62.0 in | Wt 185.0 lb

## 2023-12-19 DIAGNOSIS — Z1231 Encounter for screening mammogram for malignant neoplasm of breast: Secondary | ICD-10-CM

## 2023-12-19 DIAGNOSIS — Z01419 Encounter for gynecological examination (general) (routine) without abnormal findings: Secondary | ICD-10-CM | POA: Diagnosis not present

## 2023-12-19 DIAGNOSIS — Z780 Asymptomatic menopausal state: Secondary | ICD-10-CM

## 2023-12-19 DIAGNOSIS — Z30432 Encounter for removal of intrauterine contraceptive device: Secondary | ICD-10-CM

## 2023-12-19 DIAGNOSIS — Z1211 Encounter for screening for malignant neoplasm of colon: Secondary | ICD-10-CM

## 2023-12-19 NOTE — Patient Instructions (Signed)
I value your feedback and you entrusting [place] with your care. If you get a [facility] patient survey, I would appreciate you taking the time to let us know about your experience today. Thank you!  [facility] ([place])--[phone]

## 2023-12-20 LAB — FOLLICLE STIMULATING HORMONE: FSH: 22 m[IU]/mL

## 2023-12-20 LAB — ESTRADIOL: Estradiol: 68.5 pg/mL

## 2023-12-21 ENCOUNTER — Encounter: Payer: Self-pay | Admitting: Obstetrics and Gynecology

## 2023-12-21 DIAGNOSIS — Z1211 Encounter for screening for malignant neoplasm of colon: Secondary | ICD-10-CM

## 2023-12-27 ENCOUNTER — Other Ambulatory Visit: Payer: Self-pay | Admitting: Obstetrics and Gynecology

## 2023-12-27 DIAGNOSIS — Z30432 Encounter for removal of intrauterine contraceptive device: Secondary | ICD-10-CM

## 2023-12-27 DIAGNOSIS — Z1211 Encounter for screening for malignant neoplasm of colon: Secondary | ICD-10-CM

## 2023-12-27 DIAGNOSIS — Z1231 Encounter for screening mammogram for malignant neoplasm of breast: Secondary | ICD-10-CM

## 2023-12-27 DIAGNOSIS — Z01419 Encounter for gynecological examination (general) (routine) without abnormal findings: Secondary | ICD-10-CM

## 2023-12-27 DIAGNOSIS — Z780 Asymptomatic menopausal state: Secondary | ICD-10-CM

## 2024-01-06 ENCOUNTER — Ambulatory Visit
Admission: RE | Admit: 2024-01-06 | Discharge: 2024-01-06 | Disposition: A | Discharge status: Home / Self Care | Payer: BC Managed Care – PPO | Source: Ambulatory Visit | Attending: Obstetrics and Gynecology | Admitting: Obstetrics and Gynecology

## 2024-01-06 DIAGNOSIS — Z1231 Encounter for screening mammogram for malignant neoplasm of breast: Secondary | ICD-10-CM

## 2024-01-07 ENCOUNTER — Other Ambulatory Visit: Payer: Self-pay | Admitting: Obstetrics and Gynecology

## 2024-01-09 ENCOUNTER — Encounter: Payer: Self-pay | Admitting: Obstetrics and Gynecology

## 2024-01-23 ENCOUNTER — Telehealth: Payer: Self-pay

## 2024-01-23 ENCOUNTER — Other Ambulatory Visit: Payer: Self-pay

## 2024-01-23 DIAGNOSIS — Z1211 Encounter for screening for malignant neoplasm of colon: Secondary | ICD-10-CM

## 2024-01-23 MED ORDER — NA SULFATE-K SULFATE-MG SULF 17.5-3.13-1.6 GM/177ML PO SOLN: 1.0000 | Freq: Once | ORAL | 0 refills | Status: AC

## 2024-01-23 NOTE — Telephone Encounter (Signed)
 Gastroenterology Pre-Procedure Review  Request Date: 02/19/24 Requesting Physician: Dr. Tobi Bastos  PATIENT REVIEW QUESTIONS: The patient responded to the following health history questions as indicated:    1. Are you having any GI issues? no 2. Do you have a personal history of Polyps? no 3. Do you have a family history of Colon Cancer or Polyps? no 4. Diabetes Mellitus? no 5. Joint replacements in the past 12 months?no 6. Major health problems in the past 3 months?no 7. Any artificial heart valves, MVP, or defibrillator?no    MEDICATIONS & ALLERGIES:    Patient reports the following regarding taking any anticoagulation/antiplatelet therapy:   Plavix, Coumadin, Eliquis, Xarelto, Lovenox, Pradaxa, Brilinta, or Effient? no Aspirin? no  Patient confirms/reports the following medications:  Current Outpatient Medications  Medication Sig Dispense Refill   ascorbic acid (VITAMIN C) 1000 MG tablet Take by mouth.     Biotin 1 MG CAPS Take by mouth.     cetirizine (ZYRTEC) 10 MG tablet Take 1 tablet by mouth daily.     Cholecalciferol 25 MCG (1000 UT) tablet Take by mouth.     Dapsone 7.5 % GEL Apply to face once a day 60 g 5   Fluocin-Hydroquinone-Tretinoin (TRI-LUMA) 0.01-4-0.05 % CREA APPLY A SMALL AMOUNT TO AFFECTED AREA AT BEDTIME 30 g 1   fluticasone (FLONASE) 50 MCG/ACT nasal spray Place 2 sprays into both nostrils daily.     spironolactone (ALDACTONE) 100 MG tablet Take 1 tablet (100 mg total) by mouth daily. 90 tablet 3   vitamin B-12 (CYANOCOBALAMIN) 1000 MCG tablet Take by mouth.     No current facility-administered medications for this visit.    Patient confirms/reports the following allergies:  Allergies  Allergen Reactions   Sulfa Antibiotics Swelling    No orders of the defined types were placed in this encounter.   AUTHORIZATION INFORMATION Primary Insurance: 1D#: Group #:  Secondary Insurance: 1D#: Group #:  SCHEDULE INFORMATION: Date:  02/19/24 Time: Location:[facility]

## 2024-02-01 ENCOUNTER — Other Ambulatory Visit: Payer: Self-pay | Admitting: Dermatology

## 2024-02-12 ENCOUNTER — Encounter: Payer: Self-pay | Admitting: *Deleted

## 2024-02-18 ENCOUNTER — Encounter: Payer: Self-pay | Admitting: Gastroenterology

## 2024-02-19 ENCOUNTER — Ambulatory Visit: Admitting: Anesthesiology

## 2024-02-19 ENCOUNTER — Encounter
Admission: RE | Disposition: A | Discharge status: Home / Self Care | Payer: Self-pay | Source: Home / Self Care | Attending: Gastroenterology

## 2024-02-19 ENCOUNTER — Encounter: Payer: Self-pay | Admitting: Gastroenterology

## 2024-02-19 ENCOUNTER — Ambulatory Visit
Admission: RE | Admit: 2024-02-19 | Discharge: 2024-02-19 | Disposition: A | Discharge status: Home / Self Care | Attending: Gastroenterology | Admitting: Gastroenterology

## 2024-02-19 ENCOUNTER — Other Ambulatory Visit: Payer: Self-pay

## 2024-02-19 DIAGNOSIS — Z1211 Encounter for screening for malignant neoplasm of colon: Secondary | ICD-10-CM

## 2024-02-19 HISTORY — PX: COLONOSCOPY: SHX5424

## 2024-02-19 LAB — POCT PREGNANCY, URINE: Preg Test, Ur: NEGATIVE

## 2024-02-19 SURGERY — COLONOSCOPY
Anesthesia: General

## 2024-02-19 MED ORDER — DEXMEDETOMIDINE HCL IN NACL 80 MCG/20ML IV SOLN
INTRAVENOUS | Status: DC | PRN
  Administered 2024-02-19: 20 ug via INTRAVENOUS

## 2024-02-19 MED ORDER — LIDOCAINE HCL (PF) 2 % IJ SOLN
INTRAMUSCULAR | Status: AC
  Filled 2024-02-19: qty 5

## 2024-02-19 MED ORDER — PROPOFOL 500 MG/50ML IV EMUL
INTRAVENOUS | Status: DC | PRN
  Administered 2024-02-19: 75 ug/kg/min via INTRAVENOUS

## 2024-02-19 MED ORDER — PROPOFOL 10 MG/ML IV BOLUS
INTRAVENOUS | Status: DC | PRN
  Administered 2024-02-19: 50 mg via INTRAVENOUS
  Administered 2024-02-19: 40 mg via INTRAVENOUS

## 2024-02-19 MED ORDER — LIDOCAINE HCL (CARDIAC) PF 100 MG/5ML IV SOSY
PREFILLED_SYRINGE | INTRAVENOUS | Status: DC | PRN
  Administered 2024-02-19: 80 mg via INTRAVENOUS

## 2024-02-19 MED ORDER — PROPOFOL 1000 MG/100ML IV EMUL
INTRAVENOUS | Status: AC
  Filled 2024-02-19: qty 100

## 2024-02-19 MED ORDER — SODIUM CHLORIDE 0.9 % IV SOLN: INTRAVENOUS | Status: DC

## 2024-02-19 NOTE — Anesthesia Preprocedure Evaluation (Signed)
 Anesthesia Evaluation  Patient identified by MRN, date of birth, ID band Patient awake    Reviewed: Allergy & Precautions, H&P , NPO status , Patient's Chart, lab work & pertinent test results  Airway Mallampati: IV  TM Distance: >3 FB Neck ROM: full    Dental no notable dental hx.    Pulmonary neg pulmonary ROS   Pulmonary exam normal        Cardiovascular negative cardio ROS Normal cardiovascular exam     Neuro/Psych negative neurological ROS  negative psych ROS   GI/Hepatic negative GI ROS, Neg liver ROS,,,  Endo/Other  negative endocrine ROS    Renal/GU negative Renal ROS  negative genitourinary   Musculoskeletal   Abdominal  (+) + obese  Peds  Hematology negative hematology ROS (+)   Anesthesia Other Findings Past Medical History: No date: Acne 12/26/2015: Dysplastic nevus     Comment:  Left superior buttocks. Mild atypia, limited margins               free.   Past Surgical History: 2003: NASAL SINUS SURGERY No date: TONSILLECTOMY  BMI    Body Mass Index: 32.78 kg/m      Reproductive/Obstetrics negative OB ROS                              Anesthesia Physical Anesthesia Plan  ASA: 2  Anesthesia Plan: General   Post-op Pain Management:    Induction:   PONV Risk Score and Plan: Propofol infusion and TIVA  Airway Management Planned: Natural Airway  Additional Equipment:   Intra-op Plan:   Post-operative Plan:   Informed Consent: I have reviewed the patients History and Physical, chart, labs and discussed the procedure including the risks, benefits and alternatives for the proposed anesthesia with the patient or authorized representative who has indicated his/her understanding and acceptance.     Dental Advisory Given  Plan Discussed with: CRNA and Surgeon  Anesthesia Plan Comments:          Anesthesia Quick Evaluation

## 2024-02-19 NOTE — H&P (Signed)
 Wyline Mood, MD [address], [place], [place], [address] [address], [place], [place], [address] Phone: [phone]  Fax: [phone]  Primary Care Physician:  Dorothey Baseman, MD   Pre-Procedure History & Physical: HPI:  Angelica Gardner is a 53 y.o. female is here for an colonoscopy.   Past Medical History:  Diagnosis Date   Acne    Dysplastic nevus 12/26/2015   Left superior buttocks. Mild atypia, limited margins free.     Past Surgical History:  Procedure Laterality Date   NASAL SINUS SURGERY  2003   TONSILLECTOMY      Prior to Admission medications   Medication Sig Start Date End Date Taking? Authorizing Provider  ascorbic acid (VITAMIN C) 1000 MG tablet Take by mouth.   Yes [provider]  Biotin 1 MG CAPS Take by mouth.   Yes [provider]  cetirizine (ZYRTEC) 10 MG tablet Take 1 tablet by mouth daily.   Yes [provider]  Cholecalciferol 25 MCG (1000 UT) tablet Take by mouth.   Yes [provider]  spironolactone (ALDACTONE) 100 MG tablet Take 1 tablet (100 mg total) by mouth daily. 09/12/23  Yes Deirdre Evener, MD  spironolactone (ALDACTONE) 25 MG tablet TAKE 4 TABLETS BY MOUTH DAILY. 02/03/24  Yes Deirdre Evener, MD  vitamin B-12 (CYANOCOBALAMIN) 1000 MCG tablet Take by mouth.   Yes [provider]  Dapsone 7.5 % GEL Apply to face once a day 09/12/23   Deirdre Evener, MD  Fluocin-Hydroquinone-Tretinoin (TRI-LUMA) 0.01-4-0.05 % CREA APPLY A SMALL AMOUNT TO AFFECTED AREA AT BEDTIME 09/12/23   Deirdre Evener, MD  fluticasone [facility]) 50 MCG/ACT nasal spray Place 2 sprays into both nostrils daily. 01/05/20   [provider]    Allergies as of 01/23/2024 - Review Complete 12/19/2023  Allergen Reaction Noted   Sulfa antibiotics Swelling 01/16/2017    Family History  Problem Relation Age of Onset   Hypercholesterolemia Mother    Hypertension Mother    Asthma  Father    Hypertension Father    Breast cancer Neg Hx     Social History   Socioeconomic History   Marital status: Married    Spouse name: Not on file   Number of children: 2   Years of education: 16   Highest education level: Not on file  Occupational History   Occupation: Finance administration  Tobacco Use   Smoking status: Never   Smokeless tobacco: Never  Vaping Use   Vaping status: Never Used  Substance and Sexual Activity   Alcohol use: No   Drug use: No   Sexual activity: Yes    Birth control/protection: I.U.D.    Comment: Mirena  Other Topics Concern   Not on file  Social History Narrative   Not on file   Social Drivers of Health   Financial Resource Strain: Not on file  Food Insecurity: Not on file  Transportation Needs: Not on file  Physical Activity: Not on file  Stress: Not on file  Social Connections: Not on file  Intimate Partner Violence: Not on file    Review of Systems: See HPI, otherwise negative ROS  Physical Exam: BP (!) 122/91   Pulse 95   Temp (!) 97 F (36.1 C) (Temporal)   Resp 18   Ht 5\' 2"  (1.575 m)   Wt 81.3 kg   SpO2 97%   BMI 32.78 kg/m  General:   Alert,  pleasant  and cooperative in NAD Head:  Normocephalic and atraumatic. Neck:  Supple; no masses or thyromegaly. Lungs:  Clear throughout to auscultation, normal respiratory effort.    Heart:  +S1, +S2, Regular rate and rhythm, No edema. Abdomen:  Soft, nontender and nondistended. Normal bowel sounds, without guarding, and without rebound.   Neurologic:  Alert and  oriented x4;  grossly normal neurologically.  Impression/Plan: Angelica Gardner is here for an colonoscopy to be performed for Screening colonoscopy average risk   Risks, benefits, limitations, and alternatives regarding  colonoscopy have been reviewed with the patient.  Questions have been answered.  All parties agreeable.   Luke Salaam, MD  02/19/2024, 8:36 AM\

## 2024-02-19 NOTE — Transfer of Care (Signed)
 Immediate Anesthesia Transfer of Care Note  Patient: Angelica Gardner  Procedure(s) Performed: COLONOSCOPY  Patient Location: PACU  Anesthesia Type:General  Level of Consciousness: sedated  Airway & Oxygen Therapy: Patient Spontanous Breathing  Post-op Assessment: Report given to RN and Post -op Vital signs reviewed and stable  Post vital signs: Reviewed and stable  Last Vitals:  Vitals Value Taken Time  BP 104/72 02/19/24 0907  Temp 36.1 C 02/19/24 0907  Pulse 91 02/19/24 0907  Resp 20 02/19/24 0907  SpO2 97 % 02/19/24 0907    Last Pain:  Vitals:   02/19/24 0907  TempSrc: Temporal  PainSc: 0-No pain         Complications: No notable events documented.

## 2024-02-19 NOTE — Op Note (Signed)
 [facility] Gastroenterology Patient Name: Angelica Gardner Procedure Date: 02/19/2024 8:37 AM MRN: [id number] Account #: [id number] Date of Birth: May 23, 1971 Admit Type: Outpatient Age: 53 Room: [facility] ENDO ROOM 3 Gender: Female Note Status: Finalized Instrument Name: Prentice Docker [id number] Procedure:             Colonoscopy Indications:           Screening for colorectal malignant neoplasm Providers:             Wyline Mood MD, MD Medicines:             Monitored Anesthesia Care Complications:         No immediate complications. Procedure:             Pre-Anesthesia Assessment:                        - Prior to the procedure, a History and Physical was                         performed, and patient medications, allergies and                         sensitivities were reviewed. The patient's tolerance                         of previous anesthesia was reviewed.                        - The risks and benefits of the procedure and the                         sedation options and risks were discussed with the                         patient. All questions were answered and informed                         consent was obtained.                        - ASA Grade Assessment: II - A patient with mild                         systemic disease.                        After obtaining informed consent, the colonoscope was                         passed under direct vision. Throughout the procedure,                         the patient's blood pressure, pulse, and oxygen                         saturations were monitored continuously. The                         Colonoscope was introduced through the anus and  advanced to the the cecum, identified by the                         appendiceal orifice. The colonoscopy was performed                         with ease. The patient tolerated the procedure well.                         The quality of the bowel  preparation was excellent.                         The ileocecal valve, appendiceal orifice, and rectum                         were photographed. Findings:      The perianal and digital rectal examinations were normal.      The entire examined colon appeared normal on direct and retroflexion       views. Impression:            - The entire examined colon is normal on direct and                         retroflexion views.                        - No specimens collected. Recommendation:        - Discharge patient to home (with escort).                        - Resume previous diet.                        - Continue present medications.                        - Repeat colonoscopy in 10 years for screening                         purposes. Procedure Code(s):     --- Professional ---                        [phone], Colonoscopy, flexible; diagnostic, including                         collection of specimen(s) by brushing or washing, when                         performed (separate procedure) Diagnosis Code(s):     --- Professional ---                        Z12.11, Encounter for screening for malignant neoplasm                         of colon CPT copyright 2022 American Medical Association. All rights reserved. The codes documented in this report are preliminary and upon coder review may  be revised to meet current compliance requirements. Wyline Mood, MD Wyline Mood MD, MD 02/19/2024 9:06:54 AM This report has  been signed electronically. Number of Addenda: 0 Note Initiated On: 02/19/2024 8:37 AM Scope Withdrawal Time: 0 hours 10 minutes 17 seconds  Total Procedure Duration: 0 hours 13 minutes 2 seconds  Estimated Blood Loss:  Estimated blood loss: none.      [facility]

## 2024-02-19 NOTE — Anesthesia Postprocedure Evaluation (Signed)
 Anesthesia Post Note  Patient: Angelica Gardner  Procedure(s) Performed: COLONOSCOPY  Patient location during evaluation: Endoscopy Anesthesia Type: General Level of consciousness: awake and alert Pain management: pain level controlled Vital Signs Assessment: post-procedure vital signs reviewed and stable Respiratory status: spontaneous breathing, nonlabored ventilation and respiratory function stable Cardiovascular status: blood pressure returned to baseline and stable Postop Assessment: no apparent nausea or vomiting Anesthetic complications: no   No notable events documented.   Last Vitals:  Vitals:   02/19/24 0918 02/19/24 0924  BP: 111/84 107/75  Pulse: 86 80  Resp: 18 18  Temp:    SpO2: 97% 99%    Last Pain:  Vitals:   02/19/24 0924  TempSrc:   PainSc: 0-No pain                 Baltazar Bonier

## 2024-02-20 ENCOUNTER — Encounter: Payer: Self-pay | Admitting: Gastroenterology

## 2024-03-11 ENCOUNTER — Ambulatory Visit: Payer: BC Managed Care – PPO | Admitting: Dermatology

## 2024-03-11 ENCOUNTER — Encounter: Payer: Self-pay | Admitting: Dermatology

## 2024-03-11 DIAGNOSIS — Z1283 Encounter for screening for malignant neoplasm of skin: Secondary | ICD-10-CM

## 2024-03-11 DIAGNOSIS — L700 Acne vulgaris: Secondary | ICD-10-CM

## 2024-03-11 DIAGNOSIS — Z808 Family history of malignant neoplasm of other organs or systems: Secondary | ICD-10-CM

## 2024-03-11 DIAGNOSIS — Z86018 Personal history of other benign neoplasm: Secondary | ICD-10-CM

## 2024-03-11 DIAGNOSIS — Z79899 Other long term (current) drug therapy: Secondary | ICD-10-CM

## 2024-03-11 DIAGNOSIS — D229 Melanocytic nevi, unspecified: Secondary | ICD-10-CM

## 2024-03-11 DIAGNOSIS — Z7189 Other specified counseling: Secondary | ICD-10-CM

## 2024-03-11 DIAGNOSIS — D225 Melanocytic nevi of trunk: Secondary | ICD-10-CM | POA: Diagnosis not present

## 2024-03-11 DIAGNOSIS — D1801 Hemangioma of skin and subcutaneous tissue: Secondary | ICD-10-CM

## 2024-03-11 DIAGNOSIS — D489 Neoplasm of uncertain behavior, unspecified: Secondary | ICD-10-CM

## 2024-03-11 DIAGNOSIS — L821 Other seborrheic keratosis: Secondary | ICD-10-CM

## 2024-03-11 DIAGNOSIS — L820 Inflamed seborrheic keratosis: Secondary | ICD-10-CM | POA: Diagnosis not present

## 2024-03-11 DIAGNOSIS — W908XXA Exposure to other nonionizing radiation, initial encounter: Secondary | ICD-10-CM

## 2024-03-11 DIAGNOSIS — D492 Neoplasm of unspecified behavior of bone, soft tissue, and skin: Secondary | ICD-10-CM | POA: Diagnosis not present

## 2024-03-11 DIAGNOSIS — L814 Other melanin hyperpigmentation: Secondary | ICD-10-CM

## 2024-03-11 DIAGNOSIS — L905 Scar conditions and fibrosis of skin: Secondary | ICD-10-CM

## 2024-03-11 DIAGNOSIS — L578 Other skin changes due to chronic exposure to nonionizing radiation: Secondary | ICD-10-CM

## 2024-03-11 DIAGNOSIS — L811 Chloasma: Secondary | ICD-10-CM

## 2024-03-11 MED ORDER — DAPSONE 7.5 % EX GEL: CUTANEOUS | 5 refills | Status: AC

## 2024-03-11 MED ORDER — SPIRONOLACTONE 100 MG PO TABS: 100.0000 mg | ORAL_TABLET | Freq: Every day | ORAL | 3 refills | Status: AC

## 2024-03-11 NOTE — Progress Notes (Signed)
 Follow-Up Visit   Subjective  Angelica Gardner is a 53 y.o. female who presents for the following: Skin Cancer Screening and Full Body Skin Exam Hx of acne, hx of melasma , hx of dysplastic nevus,   The patient presents for Total-Body Skin Exam (TBSE) for skin cancer screening and mole check. The patient has spots, moles and lesions to be evaluated, some may be new or changing and the patient may have concern these could be cancer.  The following portions of the chart were reviewed this encounter and updated as appropriate: medications, allergies, medical history  Review of Systems:  No other skin or systemic complaints except as noted in HPI or Assessment and Plan.  Objective  Well appearing patient in no apparent distress; mood and affect are within normal limits.  A full examination was performed including scalp, head, eyes, ears, nose, lips, neck, chest, axillae, abdomen, back, buttocks, bilateral upper extremities, bilateral lower extremities, hands, feet, fingers, toes, fingernails, and toenails. All findings within normal limits unless otherwise noted below.   Relevant physical exam findings are noted in the Assessment and Plan.  left inferior lateral buttocks 0.6 cm brown macule   left side braline 0.6 cm brown macule   left middle side above hip 0.6 cm brown macule  right mandible x 1, right thigh x 1, neck x 3 (5) Erythematous stuck-on, waxy papule or plaque  Assessment & Plan   SKIN CANCER SCREENING PERFORMED TODAY.  ACTINIC DAMAGE - Chronic condition, secondary to cumulative UV/sun exposure - diffuse scaly erythematous macules with underlying dyspigmentation - Recommend daily broad spectrum sunscreen SPF 30+ to sun-exposed areas, reapply every 2 hours as needed.  - Staying in the shade or wearing long sleeves, sun glasses (UVA+UVB protection) and wide brim hats (4-inch brim around the entire circumference of the hat) are also recommended for sun protection.   - Call for new or changing lesions.  LENTIGINES, SEBORRHEIC KERATOSES, HEMANGIOMAS - Benign normal skin lesions - Benign-appearing - Call for any changes  MELANOCYTIC NEVI - Tan-brown and/or pink-flesh-colored symmetric macules and papules - Benign appearing on exam today - Observation - Call clinic for new or changing moles - Recommend daily use of broad spectrum spf 30+ sunscreen to sun-exposed areas.   HISTORY OF DYSPLASTIC NEVUS. Left superior buttocks. Mild atypia. 12/26/2015 No evidence of recurrence today Recommend regular full body skin exams Recommend daily broad spectrum sunscreen SPF 30+ to sun-exposed areas, reapply every 2 hours as needed.  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 Condition is symptomatic/ bothersome to patient. Not currently at goal. Treatment Plan: Today's Bp 142/81 99 Continue spironolactone  100 mg every day Continue dapsone  to face every day  Spironolactone  can cause increased urination and cause blood pressure to decrease. Please watch for signs of lightheadedness and be cautious when changing position. It can sometimes cause breast tenderness or an irregular period in premenopausal women. It can also increase potassium. The increase in potassium usually is not a concern unless you are taking other medicines that also increase potassium, so please be sure your doctor knows all of the other medications you are taking. This medication should not be taken by pregnant women.  This medicine should also not  be taken together with sulfa drugs like Bactrim (trimethoprim/sulfamethexazole).  Long term medication management.  Patient is using long term (months to years) prescription  medication  to control their dermatologic condition.  These medications require periodic monitoring to evaluate for efficacy and side effects and may require periodic laboratory monitoring.   MELASMA Exam: hyperpigmented patches at cheeks Chronic and persistent condition with duration or expected duration over one year. Condition is symptomatic/ bothersome to patient. Not currently at goal. Melasma is a chronic; persistent condition of hyperpigmented patches generally on the face, worse in summer due to higher UV exposure.    Heredity; thyroid disease; sun exposure; pregnancy; birth control pills; epilepsy medication and darker skin may predispose to Melasma.   Recommendations include: - Sun avoidance and daily broad spectrum (UVA/UVB) tinted mineral sunscreen SPF 30+, with Zinc or Titanium Dioxide. - Rx topical bleaching creams (i.e. hydroquinone) is a common treatment but should not be used long term.  Hydroquinones may be mixed with retinoids; vitamin C; steroids; Kojic Acid. - Alastin A-luminate, retinoids, vitamin C, topical tranexamic acid, glycolic acid and kojic acid can be used for brightening while on break from hydroquinone - Rx Azelaic Acid is also a treatment option that is safe for pregnancy (Category B). - OTC Heliocare can be helpful in control and prevention. - Oral Rx with Tranexamic Acid 250 mg - 650 mg po daily can be used for moderate to severe cases especially during summer (contraindications include pregnancy; lactation; hx of PE; hx of DVT; clotting disorder; heart disease; anticoagulant use and upcoming long trips)   - Chemical peels (would need multiple for best result).  - Lasers and  Microdermabrasion may also be helpful adjunct treatments.   Treatment Plan: Continue Tri-Luma  at bedtime to aa face up to 3 months.   Patient prefers Tri-Luma  > [facility] hydroquinone  Patient will call if she needs refills on [facility]   Discussed tranexamic acid - pt  declines today Oral Rx with Tranexamic Acid 250 mg - 650 mg po daily can be used for moderate to severe cases especially during summer (contraindications include pregnancy; lactation; hx of PE; hx of DVT; clotting disorder; heart disease; anticoagulant use and upcoming long trips)    Discussed laser treatment Counseling for BBL / IPL / Laser and Coordination of Care Discussed the treatment option of Broad Band Light (BBL) Barron Lien Pulsed Light (IPL)/ Laser for skin discoloration, including brown spots and redness.  Typically we recommend at least 1-3 treatment sessions about 5-8 weeks apart for best results.  Cannot have tanned skin when BBL performed, and regular use of sunscreen/photoprotection is advised after the procedure to help maintain results. The patient's condition may also require "maintenance treatments" in the future.  The fee for BBL / laser treatments is $350 per treatment session for the whole face.  A fee can be quoted for other parts of the body.  Insurance typically does not pay for BBL/laser treatments and therefore the fee is an out-of-pocket cost. Recommend prophylactic valtrex treatment. Once scheduled for procedure, will send Rx in prior to patient's appointment.   Long term medication management.  Patient is using long term (months to years) prescription medication  to control their dermatologic condition.  These medications require periodic monitoring to evaluate for efficacy and side effects and may require periodic laboratory monitoring.    NEOPLASM OF UNCERTAIN BEHAVIOR (3) left inferior lateral buttocks Epidermal / dermal shaving  Lesion diameter (cm):  0.6 Informed consent: discussed and consent obtained  Timeout: patient name, date of birth, surgical site, and procedure verified   Procedure prep:  Patient was prepped and draped in usual sterile fashion Prep type:  Isopropyl alcohol Anesthesia: the lesion was anesthetized in a standard fashion   Anesthetic:  1%  lidocaine  w/ epinephrine 1-100,000 buffered w/ 8.4% NaHCO3 Instrument used: flexible razor blade   Hemostasis achieved with: pressure, aluminum chloride and electrodesiccation   Outcome: patient tolerated procedure well   Post-procedure details: sterile dressing applied and wound care instructions given   Dressing type: bandage and petrolatum   Specimen 1 - Surgical pathology Differential Diagnosis: nevus r/o dysplasia   Check Margins: yes  left side braline Epidermal / dermal shaving  Lesion diameter (cm):  0.6 Informed consent: discussed and consent obtained   Timeout: patient name, date of birth, surgical site, and procedure verified   Procedure prep:  Patient was prepped and draped in usual sterile fashion Prep type:  Isopropyl alcohol Anesthesia: the lesion was anesthetized in a standard fashion   Anesthetic:  1% lidocaine  w/ epinephrine 1-100,000 buffered w/ 8.4% NaHCO3 Instrument used: flexible razor blade   Hemostasis achieved with: pressure, aluminum chloride and electrodesiccation   Outcome: patient tolerated procedure well   Post-procedure details: sterile dressing applied and wound care instructions given   Dressing type: bandage and petrolatum   Specimen 2 - Surgical pathology Differential Diagnosis: nevus r/o dysplasia   Check Margins: yes left middle side above hip Epidermal / dermal shaving  Lesion diameter (cm):  0.6 Informed consent: discussed and consent obtained   Timeout: patient name, date of birth, surgical site, and procedure verified   Procedure prep:  Patient was prepped and draped in usual sterile fashion Prep type:  Isopropyl alcohol Anesthesia: the lesion was anesthetized in a standard fashion   Anesthetic:  1% lidocaine  w/ epinephrine 1-100,000 buffered w/ 8.4% NaHCO3 Instrument used: flexible razor blade   Hemostasis achieved with: pressure, aluminum chloride and electrodesiccation   Outcome: patient tolerated procedure well   Post-procedure  details: sterile dressing applied and wound care instructions given   Dressing type: bandage and petrolatum   Specimen 3 - Surgical pathology Differential Diagnosis: nevus r/o dysplasia   Check Margins: Yes Nevus r/o dysplasia  INFLAMED SEBORRHEIC KERATOSIS (5) right mandible x 1, right thigh x 1, neck x 3 (5) Symptomatic, irritating, patient would like treated. Destruction of lesion - right mandible x 1, right thigh x 1, neck x 3 (5) Complexity: simple   Destruction method: cryotherapy   Informed consent: discussed and consent obtained   Timeout:  patient name, date of birth, surgical site, and procedure verified Lesion destroyed using liquid nitrogen: Yes   Region frozen until ice ball extended beyond lesion: Yes   Outcome: patient tolerated procedure well with no complications   Post-procedure details: wound care instructions given   ACNE VULGARIS   Related Medications Dapsone  7.5 % GEL Apply to face once a day Return in about 1 year (around 03/11/2025) for TBSE.  IRandee Busing, CMA, am acting as scribe for Celine Collard, MD.   Documentation: I have reviewed the above documentation for accuracy and completeness, and I agree with the above.  Celine Collard, MD

## 2024-03-11 NOTE — Patient Instructions (Addendum)

## 2024-03-17 ENCOUNTER — Ambulatory Visit: Payer: Self-pay | Admitting: Dermatology

## 2024-03-17 ENCOUNTER — Encounter: Payer: Self-pay | Admitting: Dermatology

## 2024-03-17 LAB — SURGICAL PATHOLOGY

## 2024-03-17 NOTE — Telephone Encounter (Signed)
-----   Message from Celine Collard sent at 03/17/2024  5:02 PM EDT ----- FINAL DIAGNOSIS        1. Skin, left inferior lateral buttocks :       DYSPLASTIC JUNCTIONAL NEVUS WITH MODERATE ATYPIA, INFLAMED, CLOSE TO MARGIN        2. Skin, left side braline :       DYSPLASTIC COMPOUND NEVUS WITH MODERATE ATYPIA, LIMITED MARGINS FREE        3. Skin, left middle side above hip :       DYSPLASTIC COMPOUND NEVUS WITH MODERATE ATYPIA, CLOSE TO MARGIN   1,2,3 - All three Moderate Dysplastic Recheck next visit

## 2024-03-17 NOTE — Telephone Encounter (Signed)
 Patient informed of pathology results

## 2024-09-16 ENCOUNTER — Ambulatory Visit: Payer: BC Managed Care – PPO | Admitting: Dermatology

## 2024-09-16 ENCOUNTER — Encounter: Payer: Self-pay | Admitting: Dermatology

## 2024-09-16 DIAGNOSIS — D2261 Melanocytic nevi of right upper limb, including shoulder: Secondary | ICD-10-CM

## 2024-09-16 DIAGNOSIS — D229 Melanocytic nevi, unspecified: Secondary | ICD-10-CM

## 2024-09-16 DIAGNOSIS — D489 Neoplasm of uncertain behavior, unspecified: Secondary | ICD-10-CM

## 2024-09-16 DIAGNOSIS — L0100 Impetigo, unspecified: Secondary | ICD-10-CM

## 2024-09-16 DIAGNOSIS — W908XXA Exposure to other nonionizing radiation, initial encounter: Secondary | ICD-10-CM

## 2024-09-16 DIAGNOSIS — Z79899 Other long term (current) drug therapy: Secondary | ICD-10-CM

## 2024-09-16 DIAGNOSIS — L700 Acne vulgaris: Secondary | ICD-10-CM | POA: Diagnosis not present

## 2024-09-16 DIAGNOSIS — Z808 Family history of malignant neoplasm of other organs or systems: Secondary | ICD-10-CM

## 2024-09-16 DIAGNOSIS — Z1283 Encounter for screening for malignant neoplasm of skin: Secondary | ICD-10-CM | POA: Diagnosis not present

## 2024-09-16 DIAGNOSIS — L811 Chloasma: Secondary | ICD-10-CM

## 2024-09-16 DIAGNOSIS — Z7189 Other specified counseling: Secondary | ICD-10-CM

## 2024-09-16 DIAGNOSIS — L578 Other skin changes due to chronic exposure to nonionizing radiation: Secondary | ICD-10-CM

## 2024-09-16 DIAGNOSIS — L821 Other seborrheic keratosis: Secondary | ICD-10-CM

## 2024-09-16 DIAGNOSIS — Z86018 Personal history of other benign neoplasm: Secondary | ICD-10-CM

## 2024-09-16 DIAGNOSIS — L814 Other melanin hyperpigmentation: Secondary | ICD-10-CM

## 2024-09-16 DIAGNOSIS — L708 Other acne: Secondary | ICD-10-CM

## 2024-09-16 MED ORDER — TRI-LUMA 0.01-4-0.05 % EX CREA: TOPICAL_CREAM | CUTANEOUS | 2 refills | Status: AC

## 2024-09-16 MED ORDER — MUPIROCIN 2 % EX OINT: TOPICAL_OINTMENT | CUTANEOUS | 0 refills | Status: AC

## 2024-09-16 MED ORDER — TRI-LUMA 0.01-4-0.05 % EX CREA
TOPICAL_CREAM | CUTANEOUS | 1 refills | Status: DC
Start: 2024-09-16 — End: 2024-09-16

## 2024-09-16 MED ORDER — DOXYCYCLINE MONOHYDRATE 100 MG PO CAPS: ORAL_CAPSULE | ORAL | 0 refills | Status: AC

## 2024-09-16 NOTE — Patient Instructions (Addendum)
 Biopsy Wound Care Instructions  Leave the original bandage on for 24 hours if possible.  If the bandage becomes soaked or soiled before that time, it is OK to remove it and examine the wound.  A small amount of post-operative bleeding is normal.  If excessive bleeding occurs, remove the bandage, place gauze over the site and apply continuous pressure (no peeking) over the area for 30 minutes. If this does not work, please call our clinic as soon as possible or page your doctor if it is after hours.   Once a day, cleanse the wound with soap and water. It is fine to shower. If a thick crust develops you may use a Q-tip dipped into dilute hydrogen peroxide (mix 1:1 with water) to dissolve it.  Hydrogen peroxide can slow the healing process, so use it only as needed.    After washing, apply petroleum jelly (Vaseline) or an antibiotic ointment if your doctor prescribed one for you, followed by a bandage.    For best healing, the wound should be covered with a layer of ointment at all times. If you are not able to keep the area covered with a bandage to hold the ointment in place, this may mean re-applying the ointment several times a day.  Continue this wound care until the wound has healed and is no longer open.   Itching and mild discomfort is normal during the healing process. However, if you develop pain or severe itching, please call our office.   If you have any discomfort, you can take Tylenol (acetaminophen) or ibuprofen as directed on the bottle. (Please do not take these if you have an allergy to them or cannot take them for another reason).  Some redness, tenderness and white or yellow material in the wound is normal healing.  If the area becomes very sore and red, or develops a thick yellow-green material (pus), it may be infected; please notify us .    If you have stitches, return to clinic as directed to have the stitches removed. You will continue wound care for 2-3 days after the stitches  are removed.   Wound healing continues for up to one year following surgery. It is not unusual to experience pain in the scar from time to time during the interval.  If the pain becomes severe or the scar thickens, you should notify the office.    A slight amount of redness in a scar is expected for the first six months.  After six months, the redness will fade and the scar will soften and fade.  The color difference becomes less noticeable with time.  If there are any problems, return for a post-op surgery check at your earliest convenience.  To improve the appearance of the scar, you can use silicone scar gel, cream, or sheets (such as Mederma or Serica) every night for up to one year. These are available over the counter (without a prescription).  Please call our office at [phone] for any questions or concerns.     For bumps at face  Start doxycycline  100 mg by mouth twice daily with food and drink for 1 week , after 1 week decrease to 1 tab by mouth daily with food and drink until finished with prescription  Doxycycline  should be taken with food to prevent nausea. Do not lay down for 30 minutes after taking. Be cautious with sun exposure and use good sun protection while on this medication. Pregnant women should not take this medication.  Start mupirocin 2 % ointment apply to acne bumps at face daily to twice daily until healed / can also use on wound at right forearm daily until healed   Continue spironactone 100 mg   Spironolactone  can cause increased urination and cause blood pressure to decrease. Please watch for signs of lightheadedness and be cautious when changing position. It can sometimes cause breast tenderness or an irregular period in premenopausal women. It can also increase potassium. The increase in potassium usually is not a concern unless you are taking other medicines that also increase potassium, so please be sure your doctor knows all of the other medications you  are taking. This medication should not be taken by pregnant women.  This medicine should also not be taken together with sulfa drugs like Bactrim (trimethoprim/sulfamethexazole).   Please contact us  by mychart once you have finished doxycycline  and let us  know how you did and if you are still having breakout    Melanoma ABCDEs  Melanoma is the most dangerous type of skin cancer, and is the leading cause of death from skin disease.  You are more likely to develop melanoma if you: Have light-colored skin, light-colored eyes, or red or blond hair Spend a lot of time in the sun Tan regularly, either outdoors or in a tanning bed Have had blistering sunburns, especially during childhood Have a close family member who has had a melanoma Have atypical moles or large birthmarks  Early detection of melanoma is key since treatment is typically straightforward and cure rates are extremely high if we catch it early.   The first sign of melanoma is often a change in a mole or a new dark spot.  The ABCDE system is a way of remembering the signs of melanoma.  A for asymmetry:  The two halves do not match. B for border:  The edges of the growth are irregular. C for color:  A mixture of colors are present instead of an even brown color. D for diameter:  Melanomas are usually (but not always) greater than 6mm - the size of a pencil eraser. E for evolution:  The spot keeps changing in size, shape, and color.  Please check your skin once per month between visits. You can use a small mirror in front and a large mirror behind you to keep an eye on the back side or your body.   If you see any new or changing lesions before your next follow-up, please call to schedule a visit.  Please continue daily skin protection including broad spectrum sunscreen SPF 30+ to sun-exposed areas, reapplying every 2 hours as needed when you're outdoors.   Staying in the shade or wearing long sleeves, sun glasses (UVA+UVB  protection) and wide brim hats (4-inch brim around the entire circumference of the hat) are also recommended for sun protection.    Due to recent changes in healthcare laws, you may see results of your pathology and/or laboratory studies on MyChart before the doctors have had a chance to review them. We understand that in some cases there may be results that are confusing or concerning to you. Please understand that not all results are received at the same time and often the doctors may need to interpret multiple results in order to provide you with the best plan of care or course of treatment. Therefore, we ask that you please give us  2 business days to thoroughly review all your results before contacting the office for clarification. Should we see a  critical lab result, you will be contacted sooner.   If You Need Anything After Your Visit  If you have any questions or concerns for your doctor, please call our main line at [phone] and press option 4 to reach your doctor's medical assistant. If no one answers, please leave a voicemail as directed and we will return your call as soon as possible. Messages left after 4 pm will be answered the following business day.   You may also send us  a message via MyChart. We typically respond to MyChart messages within 1-2 business days.  For prescription refills, please ask your pharmacy to contact our office. Our fax number is [phone].  If you have an urgent issue when the clinic is closed that cannot wait until the next business day, you can page your doctor at the number below.    Please note that while we do our best to be available for urgent issues outside of office hours, we are not available 24/7.   If you have an urgent issue and are unable to reach us , you may choose to seek medical care at your doctor's office, retail clinic, urgent care center, or emergency room.  If you have a medical emergency, please immediately call 911 or go to the  emergency department.  Pager Numbers  - Dr. Hester: [phone]  - Dr. Jackquline: [phone]  - Dr. Claudene: [phone]   - Dr. Raymund: [phone]  In the event of inclement weather, please call our main line at [phone] for an update on the status of any delays or closures.  Dermatology Medication Tips: Please keep the boxes that topical medications come in in order to help keep track of the instructions about where and how to use these. Pharmacies typically print the medication instructions only on the boxes and not directly on the medication tubes.   If your medication is too expensive, please contact our office at [phone] option 4 or send us  a message through MyChart.   We are unable to tell what your co-pay for medications will be in advance as this is different depending on your insurance coverage. However, we may be able to find a substitute medication at lower cost or fill out paperwork to get insurance to cover a needed medication.   If a prior authorization is required to get your medication covered by your insurance company, please allow us  1-2 business days to complete this process.  Drug prices often vary depending on where the prescription is filled and some pharmacies may offer cheaper prices.  The website www.goodrx.com contains coupons for medications through different pharmacies. The prices here do not account for what the cost may be with help from insurance (it may be cheaper with your insurance), but the website can give you the price if you did not use any insurance.  - You can print the associated coupon and take it with your prescription to the pharmacy.  - You may also stop by our office during regular business hours and pick up a GoodRx coupon card.  - If you need your prescription sent electronically to a different pharmacy, notify our office through [facility] or by phone at [phone] option 4.     Si Usted Necesita Algo Despus de  Su Visita  Tambin puede enviarnos un mensaje a travs de Clinical Cytogeneticist. Por lo general respondemos a los mensajes de MyChart en el transcurso de 1 a 2 das hbiles.  Para renovar recetas, por favor pida a su farmacia que  se ponga en contacto con nuestra oficina. Randi lakes de fax es [place] [phone].  Si tiene un asunto urgente cuando la clnica est cerrada y que no puede esperar hasta el siguiente da hbil, puede llamar/localizar a su doctor(a) al nmero que aparece a continuacin.   Por favor, tenga en cuenta que aunque hacemos todo lo posible para estar disponibles para asuntos urgentes fuera del horario de [place], no estamos disponibles las 24 horas del da, los 7 [address]  [address] de la [place].   Si tiene un problema urgente y no puede comunicarse con nosotros, puede optar por buscar atencin mdica  en el consultorio de su doctor(a), en una clnica privada, en un centro de atencin urgente o en una sala de emergencias.  Si tiene engineer, drilling, por favor llame inmediatamente al 911 o vaya a la sala de emergencias.  Nmeros de bper  - Dr. Hester: [phone]  - Dra. Jackquline: [phone]  - Dr. Claudene: [phone]  - Dra. Kitts: [phone]  En caso de inclemencias del [place], por favor llame a nuestra lnea principal al [phone] para una actualizacin sobre el estado de cualquier retraso o cierre.  Consejos para la medicacin en dermatologa: Por favor, guarde las cajas en las que vienen los medicamentos de uso tpico para ayudarle a seguir las instrucciones sobre dnde y cmo usarlos. Las farmacias generalmente imprimen las instrucciones del medicamento slo en las cajas y no directamente en los tubos del [place].   Si su medicamento es muy caro, por favor, pngase en contacto con landry rieger llamando al [phone] y presione la opcin 4 o envenos un mensaje a travs de Clinical Cytogeneticist.   No podemos decirle cul ser su copago por los medicamentos por adelantado ya que  esto es diferente dependiendo de la cobertura de su seguro. Sin embargo, es posible que podamos encontrar un medicamento sustituto a audiological scientist un formulario para que el seguro cubra el medicamento que se considera necesario.   Si se requiere una autorizacin previa para que su compaa de seguros cubra su medicamento, por favor permtanos de 1 a 2 das hbiles para completar este proceso.  Los precios de los medicamentos varan con frecuencia dependiendo del environmental consultant de dnde se surte la receta y alguna farmacias pueden ofrecer precios ms baratos.  El sitio web www.goodrx.com tiene cupones para medicamentos de health and safety inspector. Los precios aqu no tienen en cuenta lo que podra costar con la ayuda del seguro (puede ser ms barato con su seguro), pero el sitio web puede darle el precio si no utiliz tourist information centre manager.  - Puede imprimir el cupn correspondiente y llevarlo con su receta a la farmacia.  - Tambin puede pasar por nuestra oficina durante el horario de atencin regular y education officer, museum una tarjeta de cupones de GoodRx.  - Si necesita que su receta se enve electrnicamente a una farmacia diferente, informe a nuestra oficina a travs de MyChart de [place] o por telfono llamando al [phone] y presione la opcin 4.

## 2024-09-16 NOTE — Progress Notes (Signed)
 Follow-Up Visit   Subjective  Angelica Gardner is a 53 y.o. female who presents for the following: Skin Cancer Screening and Full Body Skin Exam Hx of dysplastic nevi Hx of acne reports some new breakouts at face Hx of melasma  The patient presents for Total-Body Skin Exam (TBSE) for skin cancer screening and mole check. The patient has spots, moles and lesions to be evaluated, some may be new or changing and the patient may have concern these could be cancer.  The following portions of the chart were reviewed this encounter and updated as appropriate: medications, allergies, medical history  Review of Systems:  No other skin or systemic complaints except as noted in HPI or Assessment and Plan.  Objective  Well appearing patient in no apparent distress; mood and affect are within normal limits.  A full examination was performed including scalp, head, eyes, ears, nose, lips, neck, chest, axillae, abdomen, back, buttocks, bilateral upper extremities, bilateral lower extremities, hands, feet, fingers, toes, fingernails, and toenails. All findings within normal limits unless otherwise noted below.   Relevant physical exam findings are noted in the Assessment and Plan.  right volar forearm 0.4 cm brown macule    Assessment & Plan   HISTORY OF DYSPLASTIC NEVUS. 03/11/2024 left side braline - mod , left middle side above the hip - mod, left inf lateral buttocks - mod 12/26/2015 left superior buttocks - mild atypia  No evidence of recurrence today Recommend regular full body skin exams Recommend daily broad spectrum sunscreen SPF 30+ to sun-exposed areas, reapply every 2 hours as needed.  Call if any new or changing lesions are noted between office visits     FAMILY HISTORY OF SKIN CANCER What type(s): melanoma Who affected: brother   SKIN CANCER SCREENING PERFORMED TODAY.  ACTINIC DAMAGE - Chronic condition, secondary to cumulative UV/sun exposure - diffuse scaly  erythematous macules with underlying dyspigmentation - Recommend daily broad spectrum sunscreen SPF 30+ to sun-exposed areas, reapply every 2 hours as needed.  - Staying in the shade or wearing long sleeves, sun glasses (UVA+UVB protection) and wide brim hats (4-inch brim around the entire circumference of the hat) are also recommended for sun protection.  - Call for new or changing lesions.  LENTIGINES, SEBORRHEIC KERATOSES, HEMANGIOMAS - Benign normal skin lesions - Benign-appearing - Call for any changes  MELANOCYTIC NEVI - Tan-brown and/or pink-flesh-colored symmetric macules and papules - Benign appearing on exam today - Observation - Call clinic for new or changing moles - Recommend daily use of broad spectrum spf 30+ sunscreen to sun-exposed areas.    ACNE VULGARIS with Secondary Impetigo Exam: 4 active papules on face  Secondary Impetiginous  Some acne scarring at back  Chronic and persistent condition with duration or expected duration over one year. Condition is symptomatic/ bothersome to patient. Not currently at goal. Treatment Plan: Start doxycycline  100 mg cap - take 1 tab bid for 1 week after week decrease to 1 tab by mouth daily until finished with rx. Take with food and drink.  Doxycycline  should be taken with food to prevent nausea. Do not lay down for 30 minutes after taking. Be cautious with sun exposure and use good sun protection while on this medication. Pregnant women should not take this medication.   Start mupirocin 2 % ointment - apply topically to bumps daily until healed.  Continue spironolactone  100 mg every day Continue dapsone  to face every day   Spironolactone  can cause increased urination and cause blood pressure  to decrease. Please watch for signs of lightheadedness and be cautious when changing position. It can sometimes cause breast tenderness or an irregular period in premenopausal women. It can also increase potassium. The increase in potassium  usually is not a concern unless you are taking other medicines that also increase potassium, so please be sure your doctor knows all of the other medications you are taking. This medication should not be taken by pregnant women.  This medicine should also not be taken together with sulfa drugs like Bactrim (trimethoprim/sulfamethexazole).  Long term medication management.  Patient is using long term (months to years) prescription medication  to control their dermatologic condition.  These medications require periodic monitoring to evaluate for efficacy and side effects and may require periodic laboratory monitoring.    MELASMA Exam: hyperpigmented patches at cheeks Chronic and persistent condition with duration or expected duration over one year. Condition is symptomatic/ bothersome to patient. Not currently at goal. Melasma is a chronic; persistent condition of hyperpigmented patches generally on the face, worse in summer due to higher UV exposure.    Heredity; thyroid disease; sun exposure; pregnancy; birth control pills; epilepsy medication and darker skin may predispose to Melasma.   Recommendations include: - Sun avoidance and daily broad spectrum (UVA/UVB) tinted mineral sunscreen SPF 30+, with Zinc or Titanium Dioxide. - Rx topical bleaching creams (i.e. hydroquinone) is a common treatment but should not be used long term.  Hydroquinones may be mixed with retinoids; vitamin C; steroids; Kojic Acid. - Alastin A-luminate, retinoids, vitamin C, topical tranexamic acid, glycolic acid and kojic acid can be used for brightening while on break from hydroquinone - Rx Azelaic Acid is also a treatment option that is safe for pregnancy (Category B). - OTC Heliocare can be helpful in control and prevention. - Oral Rx with Tranexamic Acid 250 mg - 650 mg po daily can be used for moderate to severe cases especially during summer (contraindications include pregnancy; lactation; hx of PE; hx of DVT; clotting  disorder; heart disease; anticoagulant use and upcoming long trips)   - Chemical peels (would need multiple for best result).  - Lasers and  Microdermabrasion may also be helpful adjunct treatments. Treatment Plan: Continue Tri-Luma  at bedtime to aa face up to 3 months.   NEOPLASM OF UNCERTAIN BEHAVIOR right volar forearm Epidermal / dermal shaving  Lesion diameter (cm):  0.4 Informed consent: discussed and consent obtained   Timeout: patient name, date of birth, surgical site, and procedure verified   Procedure prep:  Patient was prepped and draped in usual sterile fashion Prep type:  Isopropyl alcohol Anesthesia: the lesion was anesthetized in a standard fashion   Anesthetic:  1% lidocaine  w/ epinephrine 1-100,000 buffered w/ 8.4% NaHCO3 Instrument used: flexible razor blade   Hemostasis achieved with: pressure, aluminum chloride and electrodesiccation   Outcome: patient tolerated procedure well   Post-procedure details: sterile dressing applied and wound care instructions given   Dressing type: bandage and petrolatum    Specimen 1 - Surgical pathology Differential Diagnosis: nevus r/o dysplasia   Check Margins: yes  OTHER ACNE   Related Medications mupirocin ointment (BACTROBAN) 2 % Apply topically to acne bumps daily until healed also use on wound at right forearm daily until healed. doxycycline  (MONODOX ) 100 MG capsule Take 1 pill twice daily for 1 week with food and drink, then take 1 tab by mouth daily with food and drink MELASMA   Related Medications Fluocin-Hydroquinone-Tretinoin (TRI-LUMA ) 0.01-4-0.05 % CREA APPLY A SMALL AMOUNT TO AFFECTED  AREA AT BEDTIME Return in about 1 year (around 09/16/2025) for TBSE.  IEleanor Blush, CMA, am acting as scribe for Alm Rhyme, MD.   Documentation: I have reviewed the above documentation for accuracy and completeness, and I agree with the above.  Alm Rhyme, MD

## 2024-09-18 LAB — SURGICAL PATHOLOGY

## 2024-09-21 ENCOUNTER — Ambulatory Visit: Payer: Self-pay | Admitting: Dermatology

## 2024-09-21 ENCOUNTER — Encounter: Payer: Self-pay | Admitting: Dermatology

## 2024-09-21 NOTE — Telephone Encounter (Addendum)
 Called and discussed results with patient. She verbalized understanding and denied further questions.  ----- Message from Alm Rhyme sent at 09/21/2024  5:18 PM EST ----- FINAL DIAGNOSIS        1. Skin, right volar forearm :       DYSPLASTIC JUNCTIONAL LENTIGINOUS NEVUS WITH MODERATE ATYPIA, LIMITED MARGINS       FREE   Moderate Dysplastic Recheck next visit ----- Message ----- From: Interface, Lab In Three Zero One Sent: 09/18/2024   4:22 PM EST To: Alm JAYSON Rhyme, MD

## 2025-03-17 ENCOUNTER — Ambulatory Visit: Admitting: Dermatology

## 2025-09-21 ENCOUNTER — Ambulatory Visit: Admitting: Dermatology
